# Patient Record
Sex: Male | Born: 1945
Health system: Southern US, Community
[De-identification: ages and names within clinical notes are randomized; demographics above are authoritative.]

## PROBLEM LIST (undated history)

## (undated) DIAGNOSIS — T7840XA Allergy, unspecified, initial encounter: Secondary | ICD-10-CM

## (undated) DIAGNOSIS — J449 Chronic obstructive pulmonary disease, unspecified: Secondary | ICD-10-CM

## (undated) DIAGNOSIS — M1990 Unspecified osteoarthritis, unspecified site: Secondary | ICD-10-CM

## (undated) DIAGNOSIS — H269 Unspecified cataract: Secondary | ICD-10-CM

## (undated) DIAGNOSIS — C801 Malignant (primary) neoplasm, unspecified: Secondary | ICD-10-CM

## (undated) HISTORY — PX: PROSTATECTOMY: SHX69

## (undated) HISTORY — PX: COLONOSCOPY: SHX174

## (undated) HISTORY — PX: WRIST FRACTURE SURGERY: SHX121

## (undated) HISTORY — PX: POLYPECTOMY: SHX149

## (undated) HISTORY — DX: Malignant (primary) neoplasm, unspecified: C80.1

## (undated) HISTORY — DX: Unspecified cataract: H26.9

## (undated) HISTORY — DX: Chronic obstructive pulmonary disease, unspecified: J44.9

## (undated) HISTORY — PX: OTHER SURGICAL HISTORY: SHX169

## (undated) HISTORY — DX: Allergy, unspecified, initial encounter: T78.40XA

---

## 2005-11-18 DIAGNOSIS — C801 Malignant (primary) neoplasm, unspecified: Secondary | ICD-10-CM

## 2005-11-18 HISTORY — DX: Malignant (primary) neoplasm, unspecified: C80.1

## 2006-05-22 ENCOUNTER — Encounter (INDEPENDENT_AMBULATORY_CARE_PROVIDER_SITE_OTHER): Payer: Self-pay | Admitting: Specialist

## 2006-05-22 ENCOUNTER — Observation Stay (HOSPITAL_COMMUNITY): Admission: RE | Admit: 2006-05-22 | Discharge: 2006-05-23 | Payer: Self-pay | Admitting: Urology

## 2007-09-21 IMAGING — CR DG CHEST 2V
2 series · 2 of 2 positions shown · non-contrast
Comparison: none

CLINICAL DATA: 59-year-old male.  Preoperative evaluation.  Prostate carcinoma.  For robotic assisted prostatectomy.
 CHEST - 2 VIEW:

[view not recorded (1 of 2)]
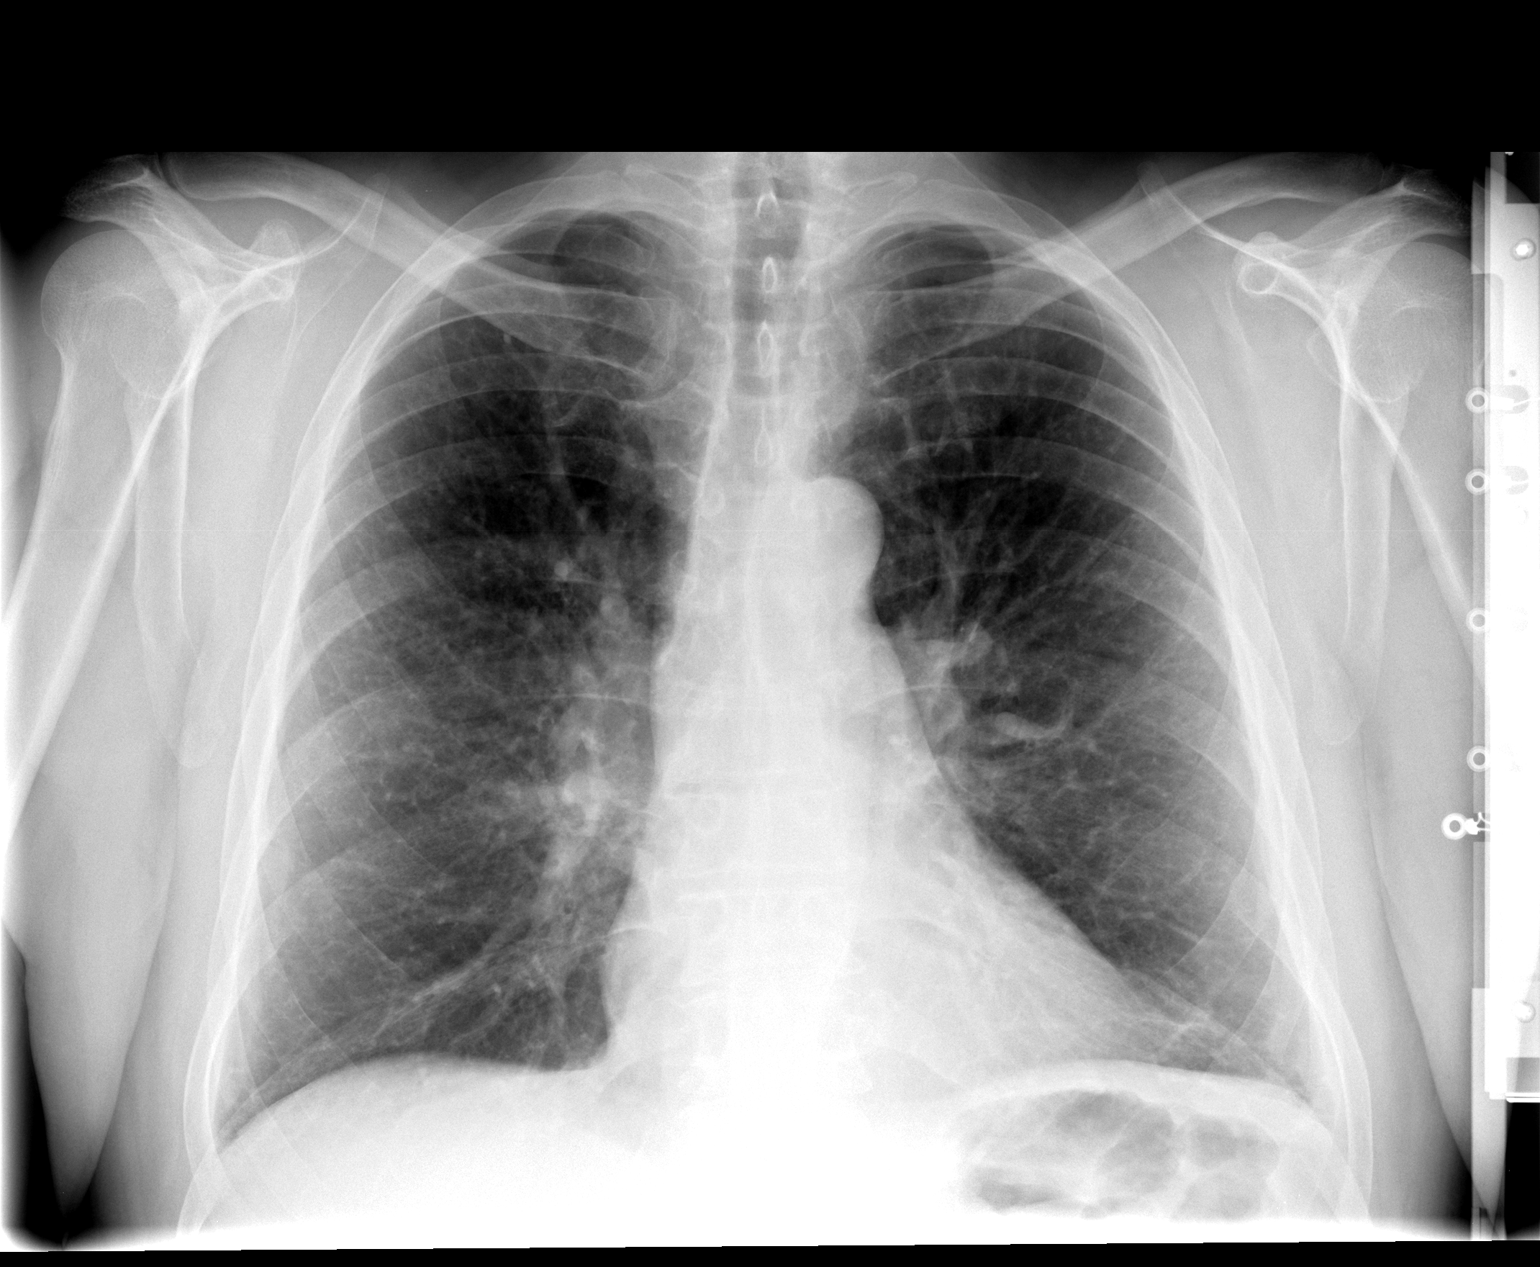

[view not recorded (2 of 2)]
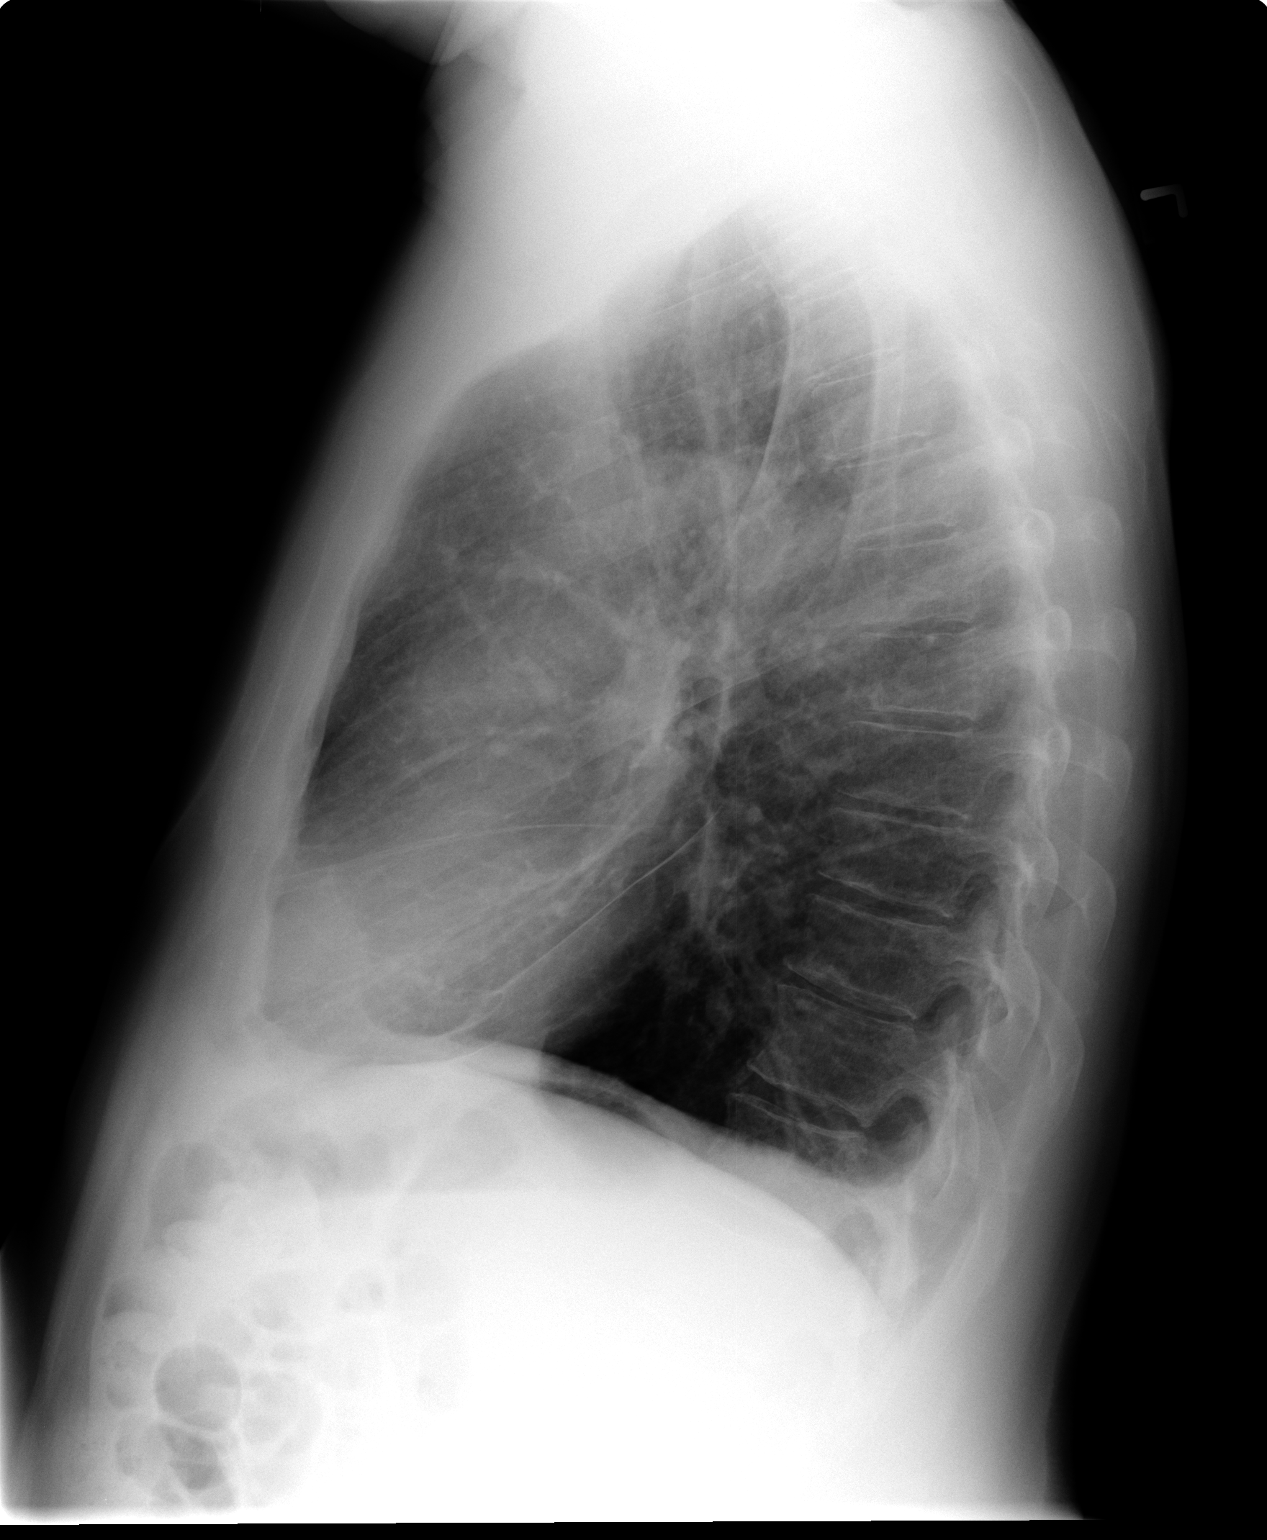

[2 of 2 positions shown; findings below may reference images not displayed]

FINDINGS: Mid vascular congestion and interstitial prominence with basilar scarring versus atelectasis.  Normal heart size.  No acute consolidation, pneumonia, edema, pneumothorax, or effusion.
IMPRESSION: 1.  No acute chest process. 
 2.  Mild vascular congestion, interstitial prominence, and bibasilar atelectasis versus scarring.

## 2008-04-26 ENCOUNTER — Ambulatory Visit: Payer: Self-pay | Admitting: Gastroenterology

## 2008-05-10 ENCOUNTER — Ambulatory Visit: Payer: Self-pay | Admitting: Gastroenterology

## 2009-03-15 ENCOUNTER — Encounter: Admission: RE | Admit: 2009-03-15 | Discharge: 2009-03-15 | Payer: Self-pay | Admitting: Family Medicine

## 2009-05-04 ENCOUNTER — Ambulatory Visit (HOSPITAL_COMMUNITY): Admission: RE | Admit: 2009-05-04 | Discharge: 2009-05-05 | Payer: Self-pay | Admitting: Urology

## 2011-02-25 LAB — CBC
HCT: 44.5 % (ref 39.0–52.0)
Hemoglobin: 14.7 g/dL (ref 13.0–17.0)
MCHC: 33 g/dL (ref 30.0–36.0)
MCV: 83.3 fL (ref 78.0–100.0)

## 2011-02-25 LAB — BASIC METABOLIC PANEL
BUN: 22 mg/dL (ref 6–23)
CO2: 26 mEq/L (ref 19–32)
Chloride: 109 mEq/L (ref 96–112)
GFR calc Af Amer: >60 mL/min (ref >60)
GFR calc non Af Amer: >60 mL/min (ref >60)
Glucose, Bld: 95 mg/dL (ref 70–99)
Potassium: 4 mEq/L (ref 3.5–5.1)

## 2011-02-25 LAB — TYPE AND SCREEN
ABO/RH(D): B POS
Antibody Screen: NEGATIVE

## 2011-02-25 LAB — PROTIME-INR
INR: 1 (ref 0.00–1.49)
Prothrombin Time: 12.8 seconds (ref 11.6–15.2)

## 2011-04-02 NOTE — Op Note (Signed)
NAME:  Scott Ho, Scott Ho NO.:  [id number]   MEDICAL RECORD NO.:  [id number]          PATIENT TYPE:  AMB   LOCATION:  DAY                          FACILITY:  [facility]   PHYSICIAN:  Martina Sinner, MD DATE OF BIRTH:  1946-09-07   DATE OF PROCEDURE:  05/04/2009  DATE OF DISCHARGE:                               OPERATIVE REPORT   PREOPERATIVE DIAGNOSIS:  Male stress urinary incontinence.   POSTOPERATIVE DIAGNOSES:  1. Male stress urinary incontinence.  2  Mild meatal stenosis.   SURGERY:  Insertion of male sling plus cystoscopy plus urethral  dilation.   ASSISTANT:  Heloise Purpura, M.D.   DESCRIPTION OF PROCEDURE:  Scott Ho has stress urinary continence.  Preoperative laboratory tests were normal.  Preoperative antibiotics  were given.  I put him in the high lithotomy position with knees at  shoulder height.  Extra care was taken with leg positioning to minimize  the risk of compartment syndrome, neuropathy, and DVT.  I marked the  obturator foramen.  He was prepped and draped.  He had mild meatal  stenosis.  I dilated him from 12-20 [place] with male sounds.  I then  easily inserted a 14-French catheter.   I marked the perineal incision in the usual position and carried it down  to mobilize the bulbous spongiosis muscle and split the muscle in the  midline.  I then mobilized the bulbar urethra and proximal to the  bifurcation of the corporal bodies I could feel and dissected repeat  down to the peroneal tendon at the S-turn to the bulbar urethra.  There  was a little bit of bleeding in this area and I used one 3-0 Vicryl to  oversew a superficial abrasion on the bulbar urethra and I also used  cautery.   I then cystoscoped the patient and identified the bladder neck.  I did  the maneuver, pushing on the peroneal tendon parallel the floor, and  there was excellent ascensus of the bladder neck and coaptation.   At this point, I placed a 3-0 Vicryl suture at  the peroneal tendon  location through the bulbous spongiosis.   __________ to identify the upper edge of the triangle between the  inferior ramus and the bulbar urethra.   I could easily mark the peroneal tendon.  I could feel the upper aspect  of the obturator foramen.  I used a spinal needle to mark appropriately.  I made a small stab incision just lateral from the foramen needle.  I  did a double pop maneuver on both sides with the male sling needle  driver under the pulp of my index finger through the upper edge of the  triangle bilaterally.  I attached a sling and drew it up bilaterally.  I  sewed the middle aspect of the graft fully at the level of the peroneal  tendon using the 3-0 Vicryl.  I gently pulled on both sides and there  was excellent base ascensus.  Copious irrigation with antibiotic was  utilized.  Four-layer closure with 3-0 Vicryl and 4-0 Vicryl was  utilized.  I made another stab incision on both sides 2 cm below the initial  incision and brought the sling through it.  I pulled the sling a little  bit tighter after bringing the knees closer together.  I cut below the  blue dots and removed the sheaths.  I cut the sling at the skin level.  4-0 Vicryl was used for the four stab incisions in the groin.  Dermabond  was applied after a dry pressure dressing was applied with mesh pants to  minimize any risk of bleeding postprocedure.   Scott Ho catheter was draining at the end of the case.  Leg position  was good.  Hopefully this operation reached his treatment goal.           ______________________________  Martina Sinner, MD  Electronically Signed     SAM/MEDQ  D:  05/04/2009  T:  05/04/2009  Job:  [id number]   cc:   Heloise Purpura, MD  Fax: [phone]

## 2011-04-05 NOTE — Discharge Summary (Signed)
NAME:  Scott Ho, Scott Ho NO.:  [id number]   MEDICAL RECORD NO.:  [id number]          PATIENT TYPE:  INP   LOCATION:  1429                         FACILITY:  [facility]   PHYSICIAN:  Heloise Purpura, MD      DATE OF BIRTH:  [birth year]   DATE OF ADMISSION:  05/22/2006  DATE OF DISCHARGE:  05/23/2006                                 DISCHARGE SUMMARY   ADMISSION DIAGNOSIS:  Clinically localized prostate cancer.   DISCHARGE DIAGNOSIS:  Clinically localized prostate cancer.   PROCEDURE:  Robotic-assisted laparoscopic radical prostatectomy.   HISTORY:  For full details please see admission history and physical.  Briefly, [name] is a 65 year old gentleman with recently diagnosed  clinical stage T1C prostate cancer with a PSA of 5.09 and Gleason score  3+3=6.  After discussion regarding management options for clinically  localized prostate cancer, he elected to proceed with surgical therapy with  the above procedure.   HOSPITAL COURSE:  On May 22, 2006, the patient was taken to the operating  room and underwent a robotic assisted laparoscopic radical prostatectomy.  This procedure was uneventful and, postoperatively, he was able to be  transferred to a regular hospital room following recovery from anesthesia.  On the evening of postoperative day #0, he began ambulating which he did  without difficulty.  By postoperative day #1, he was able to begin a clear  liquid diet and was subsequently transitioned to oral pain medication.  He  was also noted to have very minimal output from his pelvic drain and  therefore it was removed.  He continued to maintain excellent urine output.  By the afternoon of postoperative day #1, he had met all discharge criteria  and was able to be discharged home in excellent condition.   DISPOSITION:  Home.   DISCHARGE MEDICATIONS:  [name] was instructed to resume his regular home  medications excepting any aspirin, nonsteroidal  anti-inflammatory drugs, or  herbal supplements.  He was given a prescription to take Vicodin as needed  for pain, Cipro as an antibiotic to begin one day prior to removal of his  Foley catheter, and to use Colace as a stool softener.   DISCHARGE INSTRUCTIONS:  The patient was instructed to be ambulatory but  specifically to refrain from any heavy lifting or strenuous activity.  He  was given instructions to gradually advance his diet once passing flatus.  He was instructed on signs and symptoms of wound infection as well as Foley  catheter care and told to call should he have any problems.   FOLLOW UP:  [name] will follow-up in one week for removal of Foley  catheter as well as to discuss his surgical pathology in detail.           ______________________________  Heloise Purpura, MD  Electronically Signed     LB/MEDQ  D:  05/23/2006  T:  05/23/2006  Job:  [id number]   cc:   Vale Haven. Andrey Campanile, M.D.  Fax: [phone]

## 2011-04-05 NOTE — H&P (Signed)
NAME:  [name], [name] NO.:  [id number]   MEDICAL RECORD NO.:  [id number]          PATIENT TYPE:  INP   LOCATION:  0003                         FACILITY:  [facility]   PHYSICIAN:  Heloise Purpura, MD      DATE OF BIRTH:  [birth year]   DATE OF ADMISSION:  05/22/2006  DATE OF DISCHARGE:                                HISTORY & PHYSICAL   CHIEF COMPLAINT:  Prostate cancer.   HISTORY:  [name] is a 65 year old gentleman with clinical stage T1C  prostate cancer with a PSA of 5.09 and Gleason score 3+3=6.  After  discussing management options for clinically localized prostate cancer, he  elected to proceed with surgical therapy.   PAST MEDICAL HISTORY:  Rheumatoid arthritis.   PAST SURGICAL HISTORY:  None.   MEDICATIONS:  1.  Fenoprofen p.r.n.  2.  Lorazepam 1 mg p.o. p.r.n.  3.  Prednisone 5 mg p.o. p.r.n.  4.  Hydrocodone 10/650 mg p.o. p.r.n.  5.  Dexedrine 10 mg p.o. daily.  6.  Propranolol p.r.n.   ALLERGIES:  NO KNOWN DRUG ALLERGIES.   FAMILY HISTORY:  No history of GU malignancy or prostate cancer.   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  He is married.  He does smoke 3-4 cigarettes per day.  He has smoked over 40  years.  He drinks alcohol occasionally.   PHYSICAL EXAMINATION:  CONSTITUTIONAL:  The patient is a well-nourished,  well-developed, age-appropriate male in no acute distress.  CARDIOVASCULAR:  Regular rate and rhythm without obvious murmurs.  LUNGS:  Clear bilaterally.  ABDOMEN:  Soft, nontender, nondistended, without abdominal masses or bruits.  DIGITAL RECTAL EXAMINATION:  No nodularity or induration.   IMPRESSION:  Low-risk clinically localized prostate cancer.   PLAN:  [name] will undergo robotic-assisted laparoscopic radical  prostatectomy and then be admitted to the hospital for postoperative care.           ______________________________  Heloise Purpura, MD  Electronically Signed     LB/MEDQ  D:  05/22/2006  T:  05/22/2006  Job:  [id number]

## 2011-04-05 NOTE — Op Note (Signed)
NAME:  Scott Ho, Scott Ho NO.:  [id number]   MEDICAL RECORD NO.:  [id number]          PATIENT TYPE:  INP   LOCATION:  0003                         FACILITY:  [facility]   PHYSICIAN:  Heloise Purpura, MD      DATE OF BIRTH:  January 03, 1946   DATE OF PROCEDURE:  05/22/2006  DATE OF DISCHARGE:                                 OPERATIVE REPORT   PREOPERATIVE DIAGNOSIS:  Clinically localized adenocarcinoma of prostate.   POSTOPERATIVE DIAGNOSIS:  Clinically localized adenocarcinoma of prostate.   PROCEDURE:  Robotic assisted laparoscopic radical prostatectomy (bilateral  nerve sparing).   SURGEON:  Dr. Heloise Purpura.   ASSISTANT:  Dr. Cornelious Bryant.   ANESTHESIA:  General.   COMPLICATIONS:  None.   ESTIMATED BLOOD LOSS:  100 mL.   INTRAVENOUS FLUIDS:  2000 mL of lactated Ringer's.   SPECIMEN:  1.  Prostate and seminal vesicles.   DRAINS:  1.  20-French straight catheter.  2.  #19 Blake pelvic drain.   INDICATION:  Scott Ho is a 65 year old gentleman with clinical stage T1C  prostate cancer with a PSA of 5.09 and Gleason score of 3+3=6.  His  preoperative IPSS score was 14 with an IIEF score of 5.  After discussing  management options for clinically localized prostate cancer, the patient  elected to proceed with the above procedure.  Potential risks and benefits  were discussed with the patient and he consented.   DESCRIPTION OF PROCEDURE:  The patient was taken to the operating room and a  general anesthetic was administered.  He was given preoperative antibiotics,  placed in the dorsal lithotomy position, prepped and draped in the usual  sterile fashion.  Next a preoperative time-out was performed.  A Foley  catheter was then inserted into the bladder.  A site was selected 18 cm from  the pubic symphysis and just to the left of the umbilicus for placement of  the camera port.  This was placed using a standard open Hasson technique.  This allowed entry into the  peritoneal cavity under direct vision and a 12  mm port was placed and a pneumoperitoneum established.  The remaining ports  were then placed.  Bilateral 8 mm robotic ports were placed 16 cm from the  pubic symphysis and 10 cm lateral to the camera port.  An additional 8 mm  robotic port was placed in the far left lateral abdominal wall.  5 mm port  was placed between the camera port and the right robotic port.  An  additional 12 mm port was placed in the far right lateral abdominal wall for  laparoscopic assistance.  All ports were placed under direct vision and  without difficulty.  The surgical cart was then docked.  With the aid of the  cautery scissors, the bladder was reflected posteriorly allowing entry into  the space of Retzius and identification of the prostate endopelvic fascia.  The endopelvic fascia was then incised from the apex back to the base of the  prostate bilaterally allowing the underlying levator muscle fibers to be  swept laterally off  the prostate.  This isolated the dorsal venous complex  which was then stapled and divided with a 45 mm flex ETS stapler.  Attention  then turned to the bladder neck which was entered anteriorly.  This exposed  the Foley catheter.  The Foley catheter balloon was deflated.  The Foley  catheter was then used to retract the prostate anteriorly, thereby exposing  the posterior bladder neck.  The posterior bladder neck was then divided and  dissection continued posteriorly until the vasa deferentia and seminal  vesicles were identified.  The vasa deferentia were isolated and divided.  The seminal vesicles were dissected free, isolated and then lifted  anteriorly with care to control seminal vesicle arteries.  The space between  Denonvilliers' fascia and the anterior rectum was then bluntly developed  thereby isolating the vascular pedicles of the prostate.  Attention then  turned to the anterior prostate and the lateral prostatic fascia was  incised  bilaterally allowing the neurovascular bundles to be swept laterally and  posteriorly off the prostate.  The vascular pedicles of the prostate were  then divided above the neurovascular bundles with Hem-o-lok clips.  Attention then turned to the urethra which was then sharply divided allowing  the prostate specimen to be disarticulated and placed up into the abdomen  for later removal.  The pelvis was then copiously irrigated and hemostasis  was excellent.  With irrigation in the pelvis, air was injected into the  rectal catheter and there was no evidence of a rectal injury.  Attention  then turned to the urethral anastomosis.  A 0 PDS buttressing suture was  then placed between the dorsal vein and the periosteum of the pubic  symphysis.  This helped the urethra to protrude in preparation for the  urethral anastomosis.  A double-armed 3-0 Monocryl suture was then used to  perform a 360 degrees running anastomosis between the bladder neck and  urethra in a tension-free manner.  A new 20-French coude catheter was then  inserted into the bladder and irrigated.  There were no blood clots within  the bladder and the anastomosis appeared to be watertight.  A #19 Blake  drain was then brought through the left robotic port appropriately  positioned in the pelvis.  It was secured to skin with a nylon suture.  The  surgical cart was then undocked.  The prostate specimen was placed into the  Endopouch retrieval bag via the periumbilical port.  A 0 Vicryl suture was  then used to close the right lateral 12 mm port site with the aid of a  suture passer.  All remaining ports were then removed under direct vision.  The periumbilical port was then used to remove the prostate specimen within  the Endopouch retrieval bag intact.  This fascial opening was then closed  with a running 0 Vicryl suture.  Quarter percent Marcaine was injected into all port sites which were reapproximated at the skin level  with staples.  Sterile dressings were applied.  All sponge and needle counts were correct  x2 at the end of the procedure.  The patient appeared to tolerate procedure  well and without complications.  He was able to be extubated and transferred  to recovery unit in satisfactory condition.           ______________________________  Heloise Purpura, MD  Electronically Signed     LB/MEDQ  D:  05/22/2006  T:  05/22/2006  Job:  [id number]

## 2011-04-05 NOTE — Discharge Summary (Signed)
NAME:  CYRUS, RAMSBURG NO.:  [id number]   MEDICAL RECORD NO.:  [id number]          PATIENT TYPE:  INP   LOCATION:  1429                         FACILITY:  [facility]   PHYSICIAN:  Heloise Purpura, MD      DATE OF BIRTH:  02/07/1946   DATE OF ADMISSION:  05/22/2006  DATE OF DISCHARGE:  05/23/2006                                 DISCHARGE SUMMARY   Audio too short to transcribe (less than 5 seconds)           ______________________________  Heloise Purpura, MD     LB/MEDQ  D:  05/23/2006  T:  05/23/2006  Job:  [id number]

## 2015-05-04 ENCOUNTER — Encounter: Payer: Self-pay | Admitting: Gastroenterology

## 2017-04-04 ENCOUNTER — Emergency Department (HOSPITAL_COMMUNITY): Payer: Medicare Other

## 2017-04-04 ENCOUNTER — Encounter (HOSPITAL_COMMUNITY): Payer: Self-pay | Admitting: Nurse Practitioner

## 2017-04-04 ENCOUNTER — Emergency Department (HOSPITAL_COMMUNITY)
Admission: EM | Admit: 2017-04-04 | Discharge: 2017-04-04 | Disposition: A | Discharge status: Home / Self Care | Payer: Medicare Other | Attending: Emergency Medicine | Admitting: Emergency Medicine

## 2017-04-04 DIAGNOSIS — Y929 Unspecified place or not applicable: Secondary | ICD-10-CM | POA: Diagnosis not present

## 2017-04-04 DIAGNOSIS — W1830XA Fall on same level, unspecified, initial encounter: Secondary | ICD-10-CM | POA: Insufficient documentation

## 2017-04-04 DIAGNOSIS — S59912A Unspecified injury of left forearm, initial encounter: Secondary | ICD-10-CM | POA: Diagnosis present

## 2017-04-04 DIAGNOSIS — Y939 Activity, unspecified: Secondary | ICD-10-CM | POA: Insufficient documentation

## 2017-04-04 DIAGNOSIS — Z79899 Other long term (current) drug therapy: Secondary | ICD-10-CM | POA: Insufficient documentation

## 2017-04-04 DIAGNOSIS — S62115A Nondisplaced fracture of triquetrum [cuneiform] bone, left wrist, initial encounter for closed fracture: Secondary | ICD-10-CM | POA: Insufficient documentation

## 2017-04-04 DIAGNOSIS — Y999 Unspecified external cause status: Secondary | ICD-10-CM | POA: Insufficient documentation

## 2017-04-04 DIAGNOSIS — S52572A Other intraarticular fracture of lower end of left radius, initial encounter for closed fracture: Secondary | ICD-10-CM | POA: Diagnosis not present

## 2017-04-04 HISTORY — DX: Unspecified osteoarthritis, unspecified site: M19.90

## 2017-04-04 MED ORDER — LIDOCAINE HCL 2 % IJ SOLN
INTRAMUSCULAR | Status: AC
  Filled 2017-04-04: qty 20

## 2017-04-04 MED ORDER — OXYCODONE-ACETAMINOPHEN 5-325 MG PO TABS
1.0000 | ORAL_TABLET | Freq: Once | ORAL | Status: AC
  Administered 2017-04-04: 1 via ORAL
  Filled 2017-04-04: qty 1

## 2017-04-04 MED ORDER — LIDOCAINE HCL (PF) 2 % IJ SOLN
10.0000 mL | Freq: Once | INTRAMUSCULAR | Status: DC
  Filled 2017-04-04: qty 10

## 2017-04-04 MED ORDER — OXYCODONE-ACETAMINOPHEN 5-325 MG PO TABS: 1.0000 | ORAL_TABLET | Freq: Four times a day (QID) | ORAL | 0 refills | Status: DC | PRN

## 2017-04-04 NOTE — Discharge Instructions (Addendum)
Did not let splint get wet. Keep arm clean and dry. Please follow-up with Dr. Apolonio Schneiders, hand surgeon, regarding today's visit next week. Call the office first thing Monday morning to set up an appointment. Please use Tylenol as needed for pain. Please rest, ice, elevate wrist. Take Percocet as needed for severe pain.   Get help right away if: You cannot move your fingers. You lose feeling in your fingers or your hand. Your hand or your fingers turn cold and pale or blue. You notice a bad smell coming from your cast. You have drainage from underneath your cast. You have new stains from blood or drainage seeping through your cast.

## 2017-04-04 NOTE — ED Notes (Signed)
Patient transported to X-ray 

## 2017-04-04 NOTE — ED Provider Notes (Signed)
[facility] DEPT Provider Note   CSN: [id number] Arrival date & time: 04/04/17  1711  By signing my name below, I, Sonum Patel, attest that this documentation has been prepared under the direction and in the presence of [place], [place]. Electronically Signed: Ludger Nutting, Scribe. 04/04/17. 6:07 PM.  History   Chief Complaint Chief Complaint  Patient presents with  . Wrist Injury    The history is provided by the patient. No language interpreter was used.     HPI Comments: Scott Ho is a 71 y.o. male who presents to the Emergency Department complaining of left wrist fracture that was diagnosed earlier today. Patient states he had a mechanical fall with a left wrist injury. He was seen by his PCP who diagnosed him with a fracture after an XRAY. He reports becoming hypotensive in the clinic so EMS was called to transport him to the ED. He was told to follow up with ortho but the offices had already closed so he was advised to come to the ED. He complains of constant left wrist pain that is worse with movement. He was given a Toradol injection which he states was mildly helpful. He denies any previous fractures or dislocations to the same area. He denies anti-coagulant use. He denies fever, chills, nausea, vomiting, diarrhea.   Past Medical History:  Diagnosis Date  . Arthritis     There are no active problems to display for this patient.   History reviewed. No pertinent surgical history.     Home Medications    Prior to Admission medications   Medication Sig Start Date End Date Taking? Authorizing Provider  oxyCODONE-acetaminophen (PERCOCET/ROXICET) 5-325 MG tablet Take 1-2 tablets by mouth every 6 (six) hours as needed for severe pain. 04/04/17   Bettey Costa, [place]    Family History History reviewed. No pertinent family history.  Social History Social History  Substance Use Topics  . Smoking status: Never Smoker  . Smokeless tobacco: Not on  file  . Alcohol use No     Allergies   Patient has no known allergies.   Review of Systems Review of Systems  Musculoskeletal: Positive for arthralgias and joint swelling.  Neurological: Negative for weakness and numbness.  All other systems reviewed and are negative.    Physical Exam Updated Vital Signs BP (!) 150/66   Pulse 73   Temp 97.8 F (36.6 C) (Oral)   Resp 18   SpO2 99%   Physical Exam  Constitutional: He appears well-developed and well-nourished. No distress.  Well appearing  HENT:  Head: Normocephalic and atraumatic.  Nose: Nose normal.  Eyes: Conjunctivae and EOM are normal.  Neck: Normal range of motion.  Cardiovascular: Normal rate and intact distal pulses.   2+ intact distal pulses to bilateral upper extremities  Pulmonary/Chest: Effort normal. No respiratory distress.  Normal work of breathing. No respiratory distress noted.   Abdominal: Soft.  Musculoskeletal: Normal range of motion.  Left wrist with moderate swelling. Mark of 2 cm area of ecchymosis noted to dorsal side of the left wrist. There is maximal tenderness over dorsal ulnar side of the wrist. Limited range of motion due to pain. Mild deformity noted.  Neurological: He is alert.  Sensation intact to bilateral upper extremity. Muscle strength 5/5 and good against resistance to wrist flexion and extension and grip strength.   Skin: Skin is warm. No rash noted.  No redness noted to left wrist  Psychiatric: He has a normal mood and affect.  His behavior is normal.  Nursing note and vitals reviewed.    ED Treatments / Results  DIAGNOSTIC STUDIES: Oxygen Saturation is 99% on RA, normal by my interpretation.    COORDINATION OF CARE: 6:07 PM Discussed treatment plan with pt at bedside and pt agreed to plan.   Labs (all labs ordered are listed, but only abnormal results are displayed) Labs Reviewed - No data to display  EKG  EKG Interpretation None       Radiology Dg Wrist  Complete Left  Result Date: 04/04/2017 CLINICAL DATA:  Fall.  Left wrist pain. EXAM: LEFT WRIST - COMPLETE 3+ VIEW COMPARISON:  None available. FINDINGS: A comminuted distal left radial fracture is present. There is slight dorsal angulation. Fracture extends to the articular surface. The ulna is intact. Extensive soft tissue swelling is present over the dorsum of the wrist. Additional dorsal lucencies suggests a fracture of the triquetrum. IMPRESSION: 1. Comminuted distal radial fracture with dorsal angulation and intra-articular extension. 2. Dorsal triquetrum fracture. 3. Extensive soft tissue swelling over the dorsum of the wrist. Electronically Signed   By: San Morelle M.D.   On: 04/04/2017 19:14    Procedures Procedures (including critical care time)  Medications Ordered in ED Medications  lidocaine (XYLOCAINE) 2 % injection 10 mL (10 mLs Infiltration Not Given 04/04/17 2105)  lidocaine (XYLOCAINE) 2 % (with pres) injection (  Not Given 04/04/17 2109)  oxyCODONE-acetaminophen (PERCOCET/ROXICET) 5-325 MG per tablet 1 tablet (1 tablet Oral Given 04/04/17 1951)     Initial Impression / Assessment and Plan / ED Course  I have reviewed the triage vital signs and the nursing notes.  Pertinent labs & imaging results that were available during my care of the patient were reviewed by me and considered in my medical decision making (see chart for details).    Patient X-Ray with Comminuted distal radial fracture with dorsal angulation and intra-articular extension, dorsal triquetrum fracture. Pain managed in ED. Pt advised to follow up with hand surgery, Dr. Apolonio Schneiders, for further evaluation and treatment.  Pain managed in the department. Patient given a finger trap reduction here in ED. Patient given sugar tong splint while in ED, conservative therapy recommended and discussed. Patient will be dc home & is agreeable with above plan. I have also discussed reasons to return immediately to the ER.   Patient expresses understanding and agrees with plan.  I spoke with Dr. Apolonio Schneiders who recommended to have wrist reduced if there was a curvature or deformity, splint with sugar tong, and for outpatient referral to his office.  Patient also seen and evaluated by Dr. Ralene Bathe who agreed with assessment and plan.  Final Clinical Impressions(s) / ED Diagnoses   Final diagnoses:  Other closed intra-articular fracture of distal end of left radius, initial encounter  Nondisplaced fracture of triquetrum (cuneiform) bone, left wrist, initial encounter for closed fracture    New Prescriptions New Prescriptions   OXYCODONE-ACETAMINOPHEN (PERCOCET/ROXICET) 5-325 MG TABLET    Take 1-2 tablets by mouth every 6 (six) hours as needed for severe pain.    I personally performed the services described in this documentation, which was scribed in my presence. The recorded information has been reviewed and is accurate.   Flonnie Overman [place], [place] 04/04/17 2119    Quintella Reichert, MD 04/07/17 1539

## 2017-04-04 NOTE — ED Triage Notes (Signed)
Pt was at his doctors office being evaluated for the wrist pain when according to EMS he became hypotensive and almost passed out. The blood pressure has normalized but would still want to be evaluated for the left radial fx he was diagonised with.

## 2017-04-04 NOTE — ED Provider Notes (Signed)
Continued care of this patient at the request of Dr. Ralene Bathe.  Upon my assessment, patient had no acute complaints. Denied neurologic deficits.    Dg Wrist Complete Left  Result Date: 04/04/2017 CLINICAL DATA:  Status post reduction EXAM: LEFT WRIST - COMPLETE 3+ VIEW COMPARISON:  04/04/2017 FINDINGS: Interval placement of cast material across the wrist which obscures bone detail. Comminuted, impacted intra-articular distal radius fracture with residual dorsal displacement and angulation of distal fracture fragments. Small fracture fragments dorsal wrist suspect for triquetrum fracture. IMPRESSION: Interim placement of cast material across comminuted intra-articular distal radius fracture with residual dorsal displacement and angulation of distal fracture fragments. Additional bone fragments along the dorsal wrist suspicious for triquetrum fracture. Electronically Signed   By: Donavan Foil M.D.   On: 04/04/2017 21:56   Dg Wrist Complete Left  Result Date: 04/04/2017 CLINICAL DATA:  Fall.  Left wrist pain. EXAM: LEFT WRIST - COMPLETE 3+ VIEW COMPARISON:  None available. FINDINGS: A comminuted distal left radial fracture is present. There is slight dorsal angulation. Fracture extends to the articular surface. The ulna is intact. Extensive soft tissue swelling is present over the dorsum of the wrist. Additional dorsal lucencies suggests a fracture of the triquetrum. IMPRESSION: 1. Comminuted distal radial fracture with dorsal angulation and intra-articular extension. 2. Dorsal triquetrum fracture. 3. Extensive soft tissue swelling over the dorsum of the wrist. Electronically Signed   By: San Morelle M.D.   On: 04/04/2017 19:14     Clinical Course as of Apr 05 101  Fri Apr 04, 2017  2215 Patient has full flexion and extension of the fingers of his left hand. DIP and PIP joints were tested individually against resistance. Sensation was tested in each of the nerve distributions of the left hand  with no deficits noted when compared to the right hand.  [SJ]  2222 Spoke with Dr. Caralyn Guile, hand surgeon, and informed him that there were no significant changes between the pre-reduction and postreduction x-rays. I asked Dr. Caralyn Guile if we needed to reattempt reduction. Dr. Caralyn Guile states patient can remain splinted and follow up with him in the office on Monday. No need to reattempt reduction.  [SJ]    Clinical Course User Index [SJ] Jaynee Winters C, PA-C    No adjustment or removal of the splint necessary. No neurologic deficits noted on exam. Patient to follow-up with Dr. Caralyn Guile, hand surgeon, in the office. This information was communicated with the patient, who voiced understanding. Return precautions discussed.          Lorayne Bender, PA-C 04/05/17 0103    Quintella Reichert, MD 04/05/17 [phone]

## 2017-04-04 NOTE — ED Notes (Signed)
Joy, PA-C spoke with patient about calling orthopedics Monday and updated information. Pt and family verbalizes understanding. Pt ambulatory at discharge

## 2017-04-04 NOTE — ED Notes (Signed)
Bed: WTR8 Expected date:  Expected time:  Means of arrival:  Comments: 

## 2018-04-07 ENCOUNTER — Encounter: Payer: Self-pay | Admitting: Gastroenterology

## 2018-05-04 ENCOUNTER — Encounter: Payer: Self-pay | Admitting: Gastroenterology

## 2018-07-01 ENCOUNTER — Ambulatory Visit (AMBULATORY_SURGERY_CENTER): Payer: Self-pay

## 2018-07-01 VITALS — Ht 70.0 in | Wt 199.4 lb

## 2018-07-01 DIAGNOSIS — Z1211 Encounter for screening for malignant neoplasm of colon: Secondary | ICD-10-CM

## 2018-07-01 MED ORDER — NA SULFATE-K SULFATE-MG SULF 17.5-3.13-1.6 GM/177ML PO SOLN: 1.0000 | Freq: Once | ORAL | 0 refills | Status: AC

## 2018-07-01 NOTE — Progress Notes (Signed)
No egg or soy allergy known to patient  No issues with past sedation with any surgeries  or procedures, no intubation problems  No diet pills per patient No home 02 use per patient  No blood thinners per patient  Pt denies issues with constipation  No A fib or A flutter  EMMI video sent to pt's e mail. Pt denies  

## 2018-07-02 ENCOUNTER — Encounter: Payer: Self-pay | Admitting: Gastroenterology

## 2018-07-15 ENCOUNTER — Encounter: Payer: Self-pay | Admitting: Gastroenterology

## 2018-07-15 ENCOUNTER — Ambulatory Visit (AMBULATORY_SURGERY_CENTER): Payer: Medicare Other | Admitting: Gastroenterology

## 2018-07-15 VITALS — BP 121/60 | HR 61 | Temp 98.7°F | Resp 12 | Ht 70.0 in | Wt 199.0 lb

## 2018-07-15 DIAGNOSIS — Z1211 Encounter for screening for malignant neoplasm of colon: Secondary | ICD-10-CM

## 2018-07-15 DIAGNOSIS — D120 Benign neoplasm of cecum: Secondary | ICD-10-CM | POA: Diagnosis not present

## 2018-07-15 DIAGNOSIS — D124 Benign neoplasm of descending colon: Secondary | ICD-10-CM

## 2018-07-15 DIAGNOSIS — Z538 Procedure and treatment not carried out for other reasons: Secondary | ICD-10-CM | POA: Diagnosis not present

## 2018-07-15 MED ORDER — SODIUM CHLORIDE 0.9 % IV SOLN: 500.0000 mL | Freq: Once | INTRAVENOUS | Status: DC

## 2018-07-15 NOTE — Op Note (Signed)
[place] Patient Name: Scott Ho Procedure Date: 07/15/2018 10:24 AM MRN: [id number] Endoscopist: Remo Lipps P. Havery [name] , MD Age: 72 Referring MD:  Date of Birth: 27-Jul-1946 Gender: Male Account #: [id number] Procedure:                Colonoscopy Indications:              Screening for colorectal malignant neoplasm Medicines:                Monitored Anesthesia Care Procedure:                Pre-Anesthesia Assessment:                           - Prior to the procedure, a History and Physical                            was performed, and patient medications and                            allergies were reviewed. The patient's tolerance of                            previous anesthesia was also reviewed. The risks                            and benefits of the procedure and the sedation                            options and risks were discussed with the patient.                            All questions were answered, and informed consent                            was obtained. Prior Anticoagulants: The patient has                            taken no previous anticoagulant or antiplatelet                            agents. ASA Grade Assessment: II - A patient with                            mild systemic disease. After reviewing the risks                            and benefits, the patient was deemed in                            satisfactory condition to undergo the procedure.                           After obtaining informed consent, the colonoscope  was passed under direct vision. Throughout the                            procedure, the patient's blood pressure, pulse, and                            oxygen saturations were monitored continuously. The                            Model CF-HQ190L [id number]) scope was introduced                            through the anus and advanced to the the cecum,                            identified by  appendiceal orifice and ileocecal                            valve. The colonoscopy was performed without                            difficulty. The patient tolerated the procedure                            well. The quality of the bowel preparation was                            inadequate. The ileocecal valve, appendiceal                            orifice, and rectum were photographed. Scope In: 10:30:02 AM Scope Out: 10:44:28 AM Scope Withdrawal Time: 0 hours 11 minutes 37 seconds  Total Procedure Duration: 0 hours 14 minutes 26 seconds  Findings:                 The perianal and digital rectal examinations were                            normal.                           A moderate amount of semi-solid stool was found in                            the entire colon, making visualization difficult.                            The prep was inadequate for screening purposes and                            could not be cleared due to residual stool.                           A 8 to 10 mm polyp was found in the cecum. The  polyp was sessile. The polyp was removed with a                            cold snare. Resection and retrieval were complete.                           A 5 mm polyp was found in the descending colon. The                            polyp was sessile. The polyp was removed with a                            cold snare. Resection and retrieval were complete.                           The exam was otherwise without abnormality. Complications:            No immediate complications. Estimated blood loss:                            Minimal. Estimated Blood Loss:     Estimated blood loss was minimal. Impression:               - Preparation of the colon was inadequate for                            screening purposes.                           - One 8 to 10 mm polyp in the cecum, removed with a                            cold snare. Resected and  retrieved.                           - One 5 mm polyp in the descending colon, removed                            with a cold snare. Resected and retrieved.                           - The examination was otherwise normal. Recommendation:           - Patient has a contact number available for                            emergencies. The signs and symptoms of potential                            delayed complications were discussed with the                            patient. Return to normal activities tomorrow.  Written discharge instructions were provided to the                            patient.                           - Resume previous diet.                           - Continue present medications.                           - Await pathology results.                           - Repeat colonoscopy in the upcoming months with a                            2 day prep because the bowel preparation Demico Ploch P. Chrisanne Loose, MD 07/15/2018 10:51:10 AM This report has been signed electronically.

## 2018-07-15 NOTE — Patient Instructions (Addendum)
YOU HAD AN ENDOSCOPIC PROCEDURE TODAY AT [facility]:   Refer to the procedure report that was given to you for any specific questions about what was found during the examination.  If the procedure report does not answer your questions, please call your gastroenterologist to clarify.  If you requested that your care partner not be given the details of your procedure findings, then the procedure report has been included in a sealed envelope for you to review at your convenience later.  YOU SHOULD EXPECT: Some feelings of bloating in the abdomen. Passage of more gas than usual.  Walking can help get rid of the air that was put into your GI tract during the procedure and reduce the bloating. If you had a lower endoscopy (such as a colonoscopy or flexible sigmoidoscopy) you may notice spotting of blood in your stool or on the toilet paper. If you underwent a bowel prep for your procedure, you may not have a normal bowel movement for a few days.  Please Note:  You might notice some irritation and congestion in your nose or some drainage.  This is from the oxygen used during your procedure.  There is no need for concern and it should clear up in a day or so.  SYMPTOMS TO REPORT IMMEDIATELY:   Following lower endoscopy (colonoscopy or flexible sigmoidoscopy):  Excessive amounts of blood in the stool  Significant tenderness or worsening of abdominal pains  Swelling of the abdomen that is new, acute  Fever of 100F or higher  For urgent or emergent issues, a gastroenterologist can be reached at any hour by calling [phone].   DIET:  We do recommend a small meal at first, but then you may proceed to your regular diet.  Drink plenty of fluids but you should avoid alcoholic beverages for 24 hours.  MEDICATIONS: Continue present medications.  Please see handouts given to you by your recovery nurse.  Follow-up: Repeat colonoscopy in the upcoming months with a 2 day prep due to  suboptimal bowel preparation.  ACTIVITY:  You should plan to take it easy for the rest of today and you should NOT DRIVE or use heavy machinery until tomorrow (because of the sedation medicines used during the test).    FOLLOW UP: Our staff will call the number listed on your records the next business day following your procedure to check on you and address any questions or concerns that you may have regarding the information given to you following your procedure. If we do not reach you, we will leave a message.  However, if you are feeling well and you are not experiencing any problems, there is no need to return our call.  We will assume that you have returned to your regular daily activities without incident.  If any biopsies were taken you will be contacted by phone or by letter within the next 1-3 weeks.  Please call us at [phone] if you have not heard about the biopsies in 3 weeks.   Thank you for allowing [place] to provide for your healthcare needs today.  SIGNATURES/CONFIDENTIALITY: You and/or your care partner have signed paperwork which will be entered into your electronic medical record.  These signatures attest to the fact that that the information above on your After Visit Summary has been reviewed and is understood.  Full responsibility of the confidentiality of this discharge information lies with you and/or your care-partner.

## 2018-07-15 NOTE — Progress Notes (Signed)
Called to room to assist during endoscopic procedure.  Patient ID and intended procedure confirmed with present staff. Received instructions for my participation in the procedure from the performing physician.  

## 2018-07-15 NOTE — Progress Notes (Signed)
Report to PACU, RN, vss, BBS= Clear.  

## 2018-07-15 NOTE — Progress Notes (Signed)
Pt's states no medical or surgical changes since previsit or office visit. 

## 2018-07-16 ENCOUNTER — Telehealth: Payer: Self-pay

## 2018-07-16 NOTE — Telephone Encounter (Signed)
Second follow up phone call attempt, no answer, message left 

## 2018-07-16 NOTE — Telephone Encounter (Signed)
Left message on answering machine. 

## 2018-07-22 ENCOUNTER — Encounter: Payer: Self-pay | Admitting: Gastroenterology

## 2018-08-04 ENCOUNTER — Other Ambulatory Visit: Payer: Self-pay | Admitting: Family Medicine

## 2018-08-04 DIAGNOSIS — Z720 Tobacco use: Secondary | ICD-10-CM

## 2018-08-04 DIAGNOSIS — Z136 Encounter for screening for cardiovascular disorders: Secondary | ICD-10-CM

## 2018-08-18 ENCOUNTER — Ambulatory Visit
Admission: RE | Admit: 2018-08-18 | Discharge: 2018-08-18 | Disposition: A | Discharge status: Home / Self Care | Payer: Medicare Other | Source: Ambulatory Visit | Attending: Family Medicine | Admitting: Family Medicine

## 2018-08-18 DIAGNOSIS — Z136 Encounter for screening for cardiovascular disorders: Secondary | ICD-10-CM

## 2018-08-18 DIAGNOSIS — Z720 Tobacco use: Secondary | ICD-10-CM

## 2018-09-08 ENCOUNTER — Encounter: Payer: Self-pay | Admitting: Internal Medicine

## 2018-09-08 ENCOUNTER — Ambulatory Visit (INDEPENDENT_AMBULATORY_CARE_PROVIDER_SITE_OTHER): Payer: Medicare Other | Admitting: Internal Medicine

## 2018-09-08 VITALS — BP 132/82 | HR 80 | Ht 72.0 in | Wt 200.0 lb

## 2018-09-08 DIAGNOSIS — R9389 Abnormal findings on diagnostic imaging of other specified body structures: Secondary | ICD-10-CM | POA: Insufficient documentation

## 2018-09-08 DIAGNOSIS — J449 Chronic obstructive pulmonary disease, unspecified: Secondary | ICD-10-CM | POA: Diagnosis not present

## 2018-09-08 NOTE — Patient Instructions (Addendum)
Please schedule a follow up office visit in 6 weeks, call sooner if needed with full pfts on return  - also needs alpha one screening next ov with baseline esr at f/u

## 2018-09-08 NOTE — Progress Notes (Signed)
Scott Ho, male    DOB: 11-16-1946     MRN: [id number]    72 yowm with h/o RA since his 44's controls with prn prednisone   quit smoking 2014 with  some cough that resolved and bothered more by nasal congestion since around 2017 which is  no better with otc's or nasal but never seen by ent / allergy and underwent  LDSCT 08/19/18 which suggested bronchiolitis/ copd changes so referred to pulmonary clinic 09/08/2018 by Dr   Drema Dallas      09/08/2018 Pulmonary/ 1st office eval/ Shikha Bibb  Chief Complaint  Patient presents with  . Consult    Former smoker, abnormal CT, states he has occasional SOB denies chest discomfort or cough. States he just wanted to make sure he does not have cancer.   Dyspnea:  1.4 miles in 28 minutes around neighborhood includes some hills s limiting sob / more limited by arthritis typically than sob Cough: am congestion min white mucus  Sleep: no problem flat/ 2 pillows  SABA use: none   Previously needed pred 5 mg "daily" for RA but since around 2014 not needing regularly    No obvious day to day or daytime variability or assoc excess/ purulent sputum or mucus plugs or hemoptysis or cp or chest tightness, subjective wheeze or overt  hb symptoms.   Sleeps as above  without nocturnal  or early am exacerbation  of respiratory  c/o's or need for noct saba. Also denies any obvious fluctuation of symptoms with weather or environmental changes or other aggravating or alleviating factors except as outlined above   No unusual exposure hx or h/o childhood pna/ asthma or knowledge of premature birth.  Current Allergies, Complete Past Medical History, Past Surgical History, Family History, and Social History were reviewed in [employer] record.  ROS  The following are not active complaints unless bolded Hoarseness, sore throat, dysphagia, dental problems, itching, sneezing,  nasal congestion or discharge of excess mucus or purulent secretions, ear ache,    fever, chills, sweats, unintended wt loss or wt gain, classically pleuritic or exertional cp,  orthopnea pnd or arm/hand swelling  or leg swelling, presyncope, palpitations, abdominal pain, anorexia, nausea, vomiting, diarrhea  or change in bowel habits or change in bladder habits, change in stools or change in urine, dysuria, hematuria,  rash, arthralgias, visual complaints, headache, numbness, weakness or ataxia or problems with walking or coordination,  change in mood or  memory.            Past Medical History:  Diagnosis Date  . Allergy   . Arthritis   . Cancer [facility]) 2007   prostate  . Cataract    both eyes    Outpatient Medications Prior to Visit  Medication Sig Dispense Refill  . LORazepam (ATIVAN) 1 MG tablet Take 1 mg by mouth every 4 (four) hours as needed for anxiety.    . Multiple Vitamin (MULTIVITAMIN) tablet Take 1 tablet by mouth daily.    . Omega-3 Fatty Acids (FISH OIL) 1000 MG CAPS Take 1,000 mg by mouth daily.    . predniSONE (DELTASONE) 10 MG tablet Take 10 mg by mouth daily as needed.     . tadalafil (CIALIS) 10 MG tablet Take 10 mg by mouth daily as needed for erectile dysfunction.    . Zinc Sulfate (ZINC 15 PO) Take 15 mg by mouth daily.                 Objective:  BP 132/82   Pulse 80   Ht 6' (1.829 m)   Wt 200 lb (90.7 kg)   SpO2 97%   BMI 27.12 kg/m   SpO2: 97 %  RA   Hoarse intense amb wm nad    HEENT: nl dentition / oropharynx. Nl external ear canals without cough reflex -  Mod bilateral non-specific turbinate edema     NECK :  without JVD/Nodes/TM/ nl carotid upstrokes bilaterally   LUNGS: no acc muscle use,  Mild barrel  contour chest wall with bilateral  Distant bs s audible wheeze and  without cough on insp or exp maneuver and mild  Hyperresonant  to  percussion bilaterally     CV:  RRR  no s3 or murmur or increase in P2, and no edema   ABD:  soft and nontender with pos mid insp Hoover's  in the supine position. No bruits  or organomegaly appreciated, bowel sounds nl  MS:   Nl gait/  ext warm without deformities, calf tenderness, cyanosis or clubbing No obvious joint restrictions   SKIN: warm and dry without lesions    NEURO:  alert, approp, nl sensorium with  no motor or cerebellar deficits apparent.        I personally reviewed images and agree with radiology impression as follows:   Chest LDSCT  08/18/18 1. Lung-RADS 2, benign appearance or behavior. Continue annual screening with low-dose chest CT without contrast in 12 months. 2. Nonspecific mild patchy subpleural reticulation and tree-in-bud type opacity relatively symmetrically involving both lung bases, suggestive a nonspecific infectious or inflammatory bronchiolitis, with the differential including aspiration.  Aortic Atherosclerosis (ICD10-I70.0) and Emphysema (ICD10-J43.9).      Assessment   COPD GOLD III Spirometry 09/08/2018  FEV1 1.0 (30%)  Ratio 56 with classic curvature   On no rx   - Alpha One AT screening needed but not done today  Symptoms are group A = no flares, no doe but airflow obst is significant and there is emphysema on ct c/w  GOLD IIII criteria by spirometry.   informed the patient there was  no medication on the market that has proven to alter the [name]  curve/ its downward trajectory  or the likelihood of progression of their disease(unlike other chronic medical conditions such as atheroclerosis where we do think we can change the natural hx with risk reducing meds like statins)    Therefore stopping smoking and maintaining abstinence are  the most important aspects of care, not choice of inhalers or for that matter, doctors.Treatment other than smoking cessation  is entirely directed by severity of symptoms and focused also on reducing exacerbations, not attempting to change the natural history of the disease.      >>>>   At this point can't convince him he has symptoms other than non-specific chronic upper  airway complaints to will bring him back for full pfts.     Abnormal CT of the chest Reassured pt that findings don't suggest ca, the reason the study was done in the first place, and that   Although there are clearly abnormalities on CT scan, they should probably be considered "microscopic" since not obvious on plain cxr .     >>>> In the setting of obvious "macroscopic"  issues,  I am very reluctatnt to embark on an invasive w/u at this point but will arrange consevative  follow up and in the meantime see what we can do to address the patient's subjective concerns and sort out  whether RA has anything to do with these findings (eg resp bronchiolitis which unfortunately clinically is very hard to sort out from copd x that copd should stabilize if quits smoking whereas RA bronchiolitis follows/reflects the systemic severity most of the time.)     Total time devoted to counseling  > 50 % of initial 60 min office visit:  review case with pt/wife Harmon Pier discussion of options/alternatives/ personally creating written customized instructions  in presence of pt  then going over those specific  Instructions directly with the pt including how to use all of the meds but in particular covering each new medication in detail and the difference between the maintenance= "automatic" meds and the prns using an action plan format for the latter (If this problem/symptom => do that organization reading Left to right).  Please see AVS from this visit for a full list of these instructions which I personally wrote for this pt and  are unique to this visit.          Christinia Gully, MD 09/08/2018

## 2018-09-09 ENCOUNTER — Encounter: Payer: Self-pay | Admitting: Internal Medicine

## 2018-09-09 NOTE — Assessment & Plan Note (Addendum)
Reassured pt that findings don't suggest ca, the reason the study was done in the first place, and that   Although there are clearly abnormalities on CT scan, they should probably be considered "microscopic" since not obvious on plain cxr .     >>>> In the setting of obvious "macroscopic"  issues,  I am very reluctatnt to embark on an invasive w/u at this point but will arrange consevative  follow up and in the meantime see what we can do to address the patient's subjective concerns and sort out whether RA has anything to do with these findings (eg resp bronchiolitis which unfortunately clinically is very hard to sort out from copd x that copd should stabilize if quits smoking whereas RA bronchiolitis follows/reflects the systemic severity most of the time.)     Total time devoted to counseling  > 50 % of initial 60 min office visit:  review case with pt/wife Harmon Pier discussion of options/alternatives/ personally creating written customized instructions  in presence of pt  then going over those specific  Instructions directly with the pt including how to use all of the meds but in particular covering each new medication in detail and the difference between the maintenance= "automatic" meds and the prns using an action plan format for the latter (If this problem/symptom => do that organization reading Left to right).  Please see AVS from this visit for a full list of these instructions which I personally wrote for this pt and  are unique to this visit.

## 2018-09-09 NOTE — Assessment & Plan Note (Addendum)
Spirometry 09/08/2018  FEV1 1.0 (30%)  Ratio 56 with classic curvature   On no rx   - Alpha One AT screening needed but not done today  Symptoms are group A = no flares, no doe but airflow obst is significant and there is emphysema on ct c/w  GOLD IIII criteria by spirometry.   informed the patient there was  no medication on the market that has proven to alter the [name]  curve/ its downward trajectory  or the likelihood of progression of their disease(unlike other chronic medical conditions such as atheroclerosis where we do think we can change the natural hx with risk reducing meds like statins)    Therefore stopping smoking and maintaining abstinence are  the most important aspects of care, not choice of inhalers or for that matter, doctors.Treatment other than smoking cessation  is entirely directed by severity of symptoms and focused also on reducing exacerbations, not attempting to change the natural history of the disease.      >>>>   At this point can't convince him he has symptoms other than non-specific chronic upper airway complaints to will bring him back for full pfts.

## 2018-10-22 ENCOUNTER — Other Ambulatory Visit: Payer: Self-pay | Admitting: Internal Medicine

## 2018-10-22 DIAGNOSIS — J449 Chronic obstructive pulmonary disease, unspecified: Secondary | ICD-10-CM

## 2018-10-22 NOTE — Progress Notes (Unsigned)
pft  

## 2018-10-23 ENCOUNTER — Ambulatory Visit: Payer: Medicare Other | Admitting: Internal Medicine

## 2018-10-23 ENCOUNTER — Encounter: Payer: Self-pay | Admitting: Internal Medicine

## 2018-10-23 ENCOUNTER — Ambulatory Visit (INDEPENDENT_AMBULATORY_CARE_PROVIDER_SITE_OTHER): Payer: Medicare Other | Admitting: Internal Medicine

## 2018-10-23 VITALS — BP 122/70 | HR 70 | Ht 69.5 in | Wt 203.0 lb

## 2018-10-23 DIAGNOSIS — J449 Chronic obstructive pulmonary disease, unspecified: Secondary | ICD-10-CM | POA: Diagnosis not present

## 2018-10-23 LAB — CBC WITH DIFFERENTIAL/PLATELET
Basophils Absolute: 0 10*3/uL (ref 0.0–0.1)
Basophils Relative: 0.2 % (ref 0.0–3.0)
Eosinophils Absolute: 0.3 10*3/uL (ref 0.0–0.7)
Eosinophils Relative: 3.4 % (ref 0.0–5.0)
HCT: 45.3 % (ref 39.0–52.0)
Hemoglobin: 14.7 g/dL (ref 13.0–17.0)
Lymphocytes Relative: 32.3 % (ref 12.0–46.0)
Lymphs Abs: 2.4 10*3/uL (ref 0.7–4.0)
MCHC: 32.4 g/dL (ref 30.0–36.0)
MCV: 85.1 fl (ref 78.0–100.0)
Monocytes Absolute: 0.6 10*3/uL (ref 0.1–1.0)
Monocytes Relative: 8.3 % (ref 3.0–12.0)
NEUTROS ABS: 4.1 10*3/uL (ref 1.4–7.7)
NEUTROS PCT: 55.8 % (ref 43.0–77.0)
PLATELETS: 323 10*3/uL (ref 150.0–400.0)
RBC: 5.33 Mil/uL (ref 4.22–5.81)
RDW: 16.3 % — ABNORMAL HIGH (ref 11.5–15.5)
WBC: 7.4 10*3/uL (ref 4.0–10.5)

## 2018-10-23 LAB — PULMONARY FUNCTION TEST
DL/VA % pred: 124 %
DL/VA: 5.7 ml/min/mmHg/L
DLCO UNC % PRED: 81 %
DLCO unc: 25.89 ml/min/mmHg
FEF 25-75 PRE: 0.42 L/s
FEF 25-75 Post: 0.58 L/sec
FEF2575-%CHANGE-POST: 37 %
FEF2575-%PRED-PRE: 18 %
FEF2575-%Pred-Post: 25 %
FEV1-%Change-Post: 14 %
FEV1-%Pred-Post: 40 %
FEV1-%Pred-Pre: 35 %
FEV1-Post: 1.25 L
FEV1-Pre: 1.09 L
FEV1FVC-%CHANGE-POST: 3 %
FEV1FVC-%Pred-Pre: 63 %
FEV6-%CHANGE-POST: 11 %
FEV6-%PRED-PRE: 55 %
FEV6-%Pred-Post: 61 %
FEV6-PRE: 2.22 L
FEV6-Post: 2.48 L
FEV6FVC-%Change-Post: 0 %
FEV6FVC-%PRED-PRE: 101 %
FEV6FVC-%Pred-Post: 101 %
FVC-%CHANGE-POST: 10 %
FVC-%PRED-PRE: 54 %
FVC-%Pred-Post: 60 %
FVC-POST: 2.58 L
FVC-PRE: 2.34 L
POST FEV6/FVC RATIO: 96 %
Post FEV1/FVC ratio: 49 %
Pre FEV1/FVC ratio: 47 %
Pre FEV6/FVC Ratio: 95 %
RV % PRED: 127 %
RV: 3.13 L
TLC % pred: 82 %
TLC: 5.74 L

## 2018-10-23 LAB — SEDIMENTATION RATE: Sed Rate: 17 mm/hr (ref 0–20)

## 2018-10-23 MED ORDER — BUDESONIDE-FORMOTEROL FUMARATE 160-4.5 MCG/ACT IN AERO: 2.0000 | INHALATION_SPRAY | Freq: Two times a day (BID) | RESPIRATORY_TRACT | 11 refills | Status: DC

## 2018-10-23 MED ORDER — BUDESONIDE-FORMOTEROL FUMARATE 160-4.5 MCG/ACT IN AERO: 2.0000 | INHALATION_SPRAY | Freq: Two times a day (BID) | RESPIRATORY_TRACT | 0 refills | Status: DC

## 2018-10-23 NOTE — Patient Instructions (Addendum)
Symbicort 160 Take 2 puffs first thing in am and then another 2 puffs about 12 hours later.   Work on inhaler technique:  relax and gently blow all the way out then take a nice smooth deep breath back in, triggering the inhaler at same time you start breathing in.  Hold for up to 5 seconds if you can. Blow out thru nose. Rinse and gargle with water when done - if throat irritation get's worse we'll call you in a lower strength       Please remember to go to the lab department   for your tests - we will call you with the results when they are available.   Please schedule a follow up visit in 3 months but call sooner if needed

## 2018-10-23 NOTE — Progress Notes (Signed)
PFT done today. 

## 2018-10-23 NOTE — Progress Notes (Signed)
Scott Ho, male    DOB: 04-15-1946     MRN: [id number]   Brief patient profile:  23 yowm with h/o RA since his 52's controls with prn prednisone   quit smoking 2014 with  some cough that resolved and bothered more by nasal congestion since around 2017 which is  no better with otc's or nasal but never seen by ent / allergy and underwent  LDSCT 08/19/18 which suggested bronchiolitis/ copd changes so referred to pulmonary clinic 09/08/2018 by Dr   Drema Dallas and proved to have GOLD III criteria 10/23/2018    History of Present Illness  09/08/2018 Pulmonary/ 1st office eval/ Nilesh Stegall  Chief Complaint  Patient presents with  . Consult    Former smoker, abnormal CT, states he has occasional SOB denies chest discomfort or cough. States he just wanted to make sure he does not have cancer.   Dyspnea:  1.4 miles in 28 minutes around neighborhood includes some hills s limiting sob / more limited by arthritis typically than sob Cough: am congestion min white mucus  Sleep: no problem flat/ 2 pillows  Previously needed pred 5 mg "daily" for RA but since around 2014 not needing regularly  rec NO change rx     10/23/2018  f/u ov/Maleek Craver re: copd GOLD III  Chief Complaint  Patient presents with  . Follow-up    PFT's done today. His breathing is unchanged.   Dyspnea:  MMRC2 = can't walk a nl pace on a flat grade s sob but does fine slow and flat  Cough: some pnds /hoarsness x 1.5 y more of daytime issue s excess mucus  Sleeping: bed flat/ 2 pillows / on side   SABA use: none 02: none     No obvious day to day or daytime variability or assoc excess/ purulent sputum or mucus plugs or hemoptysis or cp or chest tightness, subjective wheeze or overt sinus or hb symptoms.   Sleeping as above without nocturnal  or early am exacerbation  of respiratory  c/o's or need for noct saba. Also denies any obvious fluctuation of symptoms with weather or environmental changes or other aggravating or alleviating factors  except as outlined above   No unusual exposure hx or h/o childhood pna/ asthma or knowledge of premature birth.  Current Allergies, Complete Past Medical History, Past Surgical History, Family History, and Social History were reviewed in [facility] record.  ROS  The following are not active complaints unless bolded Hoarseness, sore throat, dysphagia, dental problems, itching, sneezing,  nasal congestion or discharge of excess mucus or purulent secretions, ear ache,   fever, chills, sweats, unintended wt loss or wt gain, classically pleuritic or exertional cp,  orthopnea pnd or arm/hand swelling  or leg swelling, presyncope, palpitations, abdominal pain, anorexia, nausea, vomiting, diarrhea  or change in bowel habits or change in bladder habits, change in stools or change in urine, dysuria, hematuria,  rash, arthralgias, visual complaints, headache, numbness, weakness or ataxia or problems with walking or coordination,  change in mood or  memory.        Current Meds  Medication Sig  . B Complex Vitamins (B COMPLEX 100 PO) Take 1 capsule by mouth daily.  . Cyanocobalamin (B-12 PO) Take 1 capsule by mouth daily.  Marland Kitchen loratadine (CLARITIN) 10 MG tablet Take 10 mg by mouth daily.  Marland Kitchen LORazepam (ATIVAN) 1 MG tablet Take 1 mg by mouth every 4 (four) hours as needed for anxiety.  . Menaquinone-7 (VITAMIN  K2 PO) Take 1 capsule by mouth every other day.  . Multiple Vitamin (MULTIVITAMIN) tablet Take 1 tablet by mouth daily.  . Multiple Vitamins-Minerals (ICAPS) CAPS Take 1 capsule by mouth daily.  . Omega-3 Fatty Acids (FISH OIL) 1000 MG CAPS Take 1,000 mg by mouth daily.  . Pyridoxine HCl (B-6 PO) Take 1 capsule by mouth daily.  . tadalafil (CIALIS) 10 MG tablet Take 10 mg by mouth daily as needed for erectile dysfunction.  Marland Kitchen VITAMIN A PO Take 1 capsule by mouth every other day.  . vitamin C (ASCORBIC ACID) 500 MG tablet Take 500 mg by mouth daily.  . Zinc Sulfate (ZINC 15 PO)  Take 15 mg by mouth daily.                   Objective:     amb wm nad/ mildly hoarse   Wt Readings from Last 3 Encounters:  09/08/18 200 lb (90.7 kg)  07/15/18 199 lb (90.3 kg)  07/01/18 199 lb 6.4 oz (90.4 kg)     Vital signs reviewed - Note on arrival 02 sats  96% on RA        HEENT: nl dentition / oropharynx. Nl external ear canals without cough reflex -  Mild bilateral non-specific turbinate edema     NECK :  without JVD/Nodes/TM/ nl carotid upstrokes bilaterally   LUNGS: no acc muscle use,  Mild barrel  contour chest wall with bilateral late exp  wheeze and  without cough on insp or exp maneuver and mild  Hyperresonant  to  percussion bilaterally     CV:  RRR  no s3 or murmur or increase in P2, and no edema   ABD:  soft and nontender with pos late  insp Hoover's  in the supine position. No bruits or organomegaly appreciated, bowel sounds nl  MS:   Nl gait/  ext warm without deformities, calf tenderness, cyanosis or clubbing No obvious joint restrictions   SKIN: warm and dry without lesions    NEURO:  alert, approp, nl sensorium with  no motor or cerebellar deficits apparent.          Labs ordered 10/23/2018    Alpha one AT screen       Assessment

## 2018-10-24 ENCOUNTER — Encounter: Payer: Self-pay | Admitting: Internal Medicine

## 2018-10-24 NOTE — Assessment & Plan Note (Addendum)
Spirometry 09/08/2018  FEV1 1.0 (30%)  Ratio 56 with classic curvature   On no rx     - PFT's  10/23/2018  FEV1 1.25  (40 % ) ratio 49  p 14 % improvement from saba p nothing prior to study with DLCO  81 % corrects to 124 % for alv volume   - Alpha One AT screening sent   Not clear whether this is copd with AB component or RA related bronchiolitis (as suggested as a possibility on CT chest 08/18/18)  but either way best choice is trial of symb 160 2bid   - The proper method of use, as well as anticipated side effects, of a metered-dose inhaler are discussed and demonstrated to the patient. Improved effectiveness after extensive coaching during this visit to a level of approximately 75 % from a baseline of 50 %    - also needs alpha one at def screening to be complete.    I had an extended discussion with the patient and wife  reviewing all relevant studies completed to date and  lasting 15 to 20 minutes of a 25 minute visit    See device teaching which extended face to face time for this visit.  Each maintenance medication was reviewed in detail including emphasizing most importantly the difference between maintenance and prns and under what circumstances the prns are to be triggered using an action plan format that is not reflected in the computer generated alphabetically organized AVS which I have not found useful in most complex patients, especially with respiratory illnesses  Please see AVS for specific instructions unique to this visit that I personally wrote and verbalized to the the pt in detail and then reviewed with pt  by my nurse highlighting any  changes in therapy recommended at today's visit to their plan of care.

## 2018-10-28 LAB — ALPHA-1 ANTITRYPSIN PHENOTYPE: A-1 Antitrypsin, Ser: 139 mg/dL (ref 83–199)

## 2018-10-29 NOTE — Progress Notes (Signed)
Left detailed msg.

## 2019-01-16 ENCOUNTER — Encounter: Payer: Self-pay | Admitting: Gastroenterology

## 2019-01-22 ENCOUNTER — Ambulatory Visit: Payer: Medicare Other | Admitting: Internal Medicine

## 2019-01-26 ENCOUNTER — Ambulatory Visit: Payer: Medicare Other | Admitting: Internal Medicine

## 2019-01-26 ENCOUNTER — Encounter: Payer: Self-pay | Admitting: Internal Medicine

## 2019-01-26 VITALS — BP 110/70 | HR 80 | Ht 69.5 in | Wt 213.0 lb

## 2019-01-26 DIAGNOSIS — J449 Chronic obstructive pulmonary disease, unspecified: Secondary | ICD-10-CM | POA: Diagnosis not present

## 2019-01-26 MED ORDER — BUDESONIDE-FORMOTEROL FUMARATE 160-4.5 MCG/ACT IN AERO: 2.0000 | INHALATION_SPRAY | Freq: Two times a day (BID) | RESPIRATORY_TRACT | 0 refills | Status: DC

## 2019-01-26 NOTE — Assessment & Plan Note (Signed)
Quit smoking 2014 but also has RA Spirometry 09/08/2018  FEV1 1.0 (30%)  Ratio 56 with classic curvature   On no rx    - PFT's  10/23/2018  FEV1 1.25  (40 % ) ratio 49  p 14 % improvement from saba p nothing prior to study with DLCO  81 % corrects to 124 % for alv volume   - Alpha One AT screening 10/23/2018   MM level 139  - 01/26/2019  After extensive coaching inhaler device,  effectiveness =    75%  Above improvement in fev1 and since then further improvement in symptoms strongly supports more of an AB component of copd over RA related lung dz   I had an extended discussion with the patient reviewing all relevant studies completed to date and  lasting 15 to 20 minutes of a 25 minute visit    See device teaching which extended face to face time for this visit.  Each maintenance medication was reviewed in detail including emphasizing most importantly the difference between maintenance and prns and under what circumstances the prns are to be triggered using an action plan format that is not reflected in the computer generated alphabetically organized AVS which I have not found useful in most complex patients, especially with respiratory illnesses  Please see AVS for specific instructions unique to this visit that I personally wrote and verbalized to the the pt in detail and then reviewed with pt  by my nurse highlighting any  changes in therapy recommended at today's visit to their plan of care.

## 2019-01-26 NOTE — Progress Notes (Signed)
Scott Ho, male    DOB: May 12, 1946     MRN: [id number]     Brief patient profile:  29 yowm MM with h/o RA since his 37's controls with prn prednisone   quit smoking 2014 with  some cough that resolved and bothered more by nasal congestion since around 2017 which is  no better with otc's or nasal but never seen by ent / allergy and underwent  LDSCT 08/19/18 which suggested bronchiolitis/ copd changes so referred to pulmonary clinic 09/08/2018 by Dr   Drema Dallas and proved to have GOLD III criteria 10/23/2018    History of Present Illness  09/08/2018 Pulmonary/ 1st office eval/   Chief Complaint  Patient presents with  . Consult    Former smoker, abnormal CT, states he has occasional SOB denies chest discomfort or cough. States he just wanted to make sure he does not have cancer.   Dyspnea:  1.4 miles in 28 minutes around neighborhood includes some hills s limiting sob / more limited by arthritis typically than sob Cough: am congestion min white mucus  Sleep: no problem flat/ 2 pillows  Previously needed pred 5 mg "daily" for RA but since around 2014 not needing regularly  rec No change rx     10/23/2018  f/u ov/ re: copd GOLD III  Chief Complaint  Patient presents with  . Follow-up    PFT's done today. His breathing is unchanged.   Dyspnea:  MMRC2 = can't walk a nl pace on a flat grade s sob but does fine slow and flat  Cough: some pnds /hoarsness x 1.5 y more of daytime issue s excess mucus  Sleeping: bed flat/ 2 pillows / on side   rec Symbicort 160 Take 2 puffs first thing in am and then another 2 puffs about 12 hours later.  Work on Doctor, hospital.    01/26/2019  f/u ov/ re: GOLD III copd / symbicort 160 2bid  And RA and has pred so use prn very rarely  Chief Complaint  Patient presents with  . Follow-up    Breathing is overall doing well. No new co's.   Dyspnea:  MMRC1 = can walk nl pace, flat grade, can't hurry or go uphills or steps s sob   Cough:  no Sleeping: 2 pillows on side  SABA use: no 02: none   No obvious day to day or daytime variability or assoc excess/ purulent sputum or mucus plugs or hemoptysis or cp or chest tightness, subjective wheeze or overt sinus or hb symptoms.   Sleeping as abov without nocturnal  or early am exacerbation  of respiratory  c/o's or need for noct saba. Also denies any obvious fluctuation of symptoms with weather or environmental changes or other aggravating or alleviating factors except as outlined above   No unusual exposure hx or h/o childhood pna/ asthma or knowledge of premature birth.  Current Allergies, Complete Past Medical History, Past Surgical History, Family History, and Social History were reviewed in [employer] record.  ROS  The following are not active complaints unless bolded Hoarseness, sore throat, dysphagia, dental problems, itching, sneezing,  nasal congestion or discharge of excess mucus or purulent secretions, ear ache,   fever, chills, sweats, unintended wt loss or wt gain, classically pleuritic or exertional cp,  orthopnea pnd or arm/hand swelling  or leg swelling, presyncope, palpitations, abdominal pain, anorexia, nausea, vomiting, diarrhea  or change in bowel habits or change in bladder habits, change in stools or  change in urine, dysuria, hematuria,  rash, arthralgias, visual complaints, headache, numbness, weakness or ataxia or problems with walking or coordination,  change in mood or  memory.        Current Meds  Medication Sig  . B Complex Vitamins (B COMPLEX 100 PO) Take 1 capsule by mouth daily.  . budesonide-formoterol (SYMBICORT) 160-4.5 MCG/ACT inhaler Inhale 2 puffs into the lungs 2 (two) times daily.  . Cyanocobalamin (B-12 PO) Take 1 capsule by mouth daily.  Marland Kitchen loratadine (CLARITIN) 10 MG tablet Take 10 mg by mouth daily.  Marland Kitchen LORazepam (ATIVAN) 1 MG tablet Take 1 mg by mouth every 4 (four) hours as needed for anxiety.  . Menaquinone-7  (VITAMIN K2 PO) Take 1 capsule by mouth every other day.  . Multiple Vitamin (MULTIVITAMIN) tablet Take 1 tablet by mouth daily.  . Multiple Vitamins-Minerals (ICAPS) CAPS Take 1 capsule by mouth daily.  . Omega-3 Fatty Acids (FISH OIL) 1000 MG CAPS Take 1,000 mg by mouth daily.  . Pyridoxine HCl (B-6 PO) Take 1 capsule by mouth daily.  . tadalafil (CIALIS) 10 MG tablet Take 10 mg by mouth daily as needed for erectile dysfunction.  Marland Kitchen VITAMIN A PO Take 1 capsule by mouth every other day.  . vitamin C (ASCORBIC ACID) 500 MG tablet Take 500 mg by mouth daily.  . Zinc Sulfate (ZINC 15 PO) Take 15 mg by mouth daily.          Objective:     pleasant amb wm nad  Vital signs reviewed - Note on arrival 02 sats  97% on RA    01/26/2019        213    09/08/18 200 lb (90.7 kg)  07/15/18 199 lb (90.3 kg)  07/01/18 199 lb 6.4 oz (90.4 kg)      HEENT: nl dentition / oropharynx. Nl external ear canals without cough reflex -  Mild bilateral non-specific turbinate edema     NECK :  without JVD/Nodes/TM/ nl carotid upstrokes bilaterally   LUNGS: no acc muscle use,  Mild barrel  contour chest wall with bilateral  Distant bs s audible wheeze and  without cough on insp or exp maneuver and mild  Hyperresonant  to  percussion bilaterally     CV:  RRR  no s3 or murmur or increase in P2, and no edema   ABD:  soft and nontender with pos late  insp Hoover's  in the supine position. No bruits or organomegaly appreciated, bowel sounds nl  MS:   Nl gait/  ext warm without deformities, calf tenderness, cyanosis or clubbing No obvious joint restrictions   SKIN: warm and dry without lesions    NEURO:  alert, approp, nl sensorium with  no motor or cerebellar deficits apparent.              Assessment

## 2019-01-26 NOTE — Patient Instructions (Signed)
Work on inhaler technique:  relax and gently blow all the way out then take a nice smooth deep breath back in, triggering the inhaler at same time you start breathing in.  Hold for up to 5 seconds if you can. Blow out thru nose. Rinse and gargle with water when done     Please schedule a follow up visit in 6  months but call sooner if needed  

## 2019-02-02 ENCOUNTER — Encounter: Payer: Self-pay | Admitting: Gastroenterology

## 2019-02-23 ENCOUNTER — Ambulatory Visit (AMBULATORY_SURGERY_CENTER): Payer: Medicare Other | Admitting: *Deleted

## 2019-02-23 ENCOUNTER — Other Ambulatory Visit: Payer: Self-pay

## 2019-02-23 VITALS — Ht 69.5 in | Wt 208.0 lb

## 2019-02-23 DIAGNOSIS — Z860100 Personal history of colon polyps, unspecified: Secondary | ICD-10-CM

## 2019-02-23 MED ORDER — NA SULFATE-K SULFATE-MG SULF 17.5-3.13-1.6 GM/177ML PO SOLN: 1.0000 | Freq: Once | ORAL | 0 refills | Status: AC

## 2019-02-23 NOTE — Progress Notes (Signed)
No egg or soy allergy known to patient  No issues with past sedation with any surgeries  or procedures, no intubation problems  No diet pills per patient No home 02 use per patient  No blood thinners per patient  Pt denies issues with constipation  No A fib or A flutter  EMMI video sent to pt's e mail  Informed pt.that prep instructions will be mailed. Prep instructions reviewed with pt. Via phone.

## 2019-03-10 ENCOUNTER — Encounter: Payer: Medicare Other | Admitting: Gastroenterology

## 2019-03-31 ENCOUNTER — Encounter: Payer: Medicare Other | Admitting: Gastroenterology

## 2019-03-31 ENCOUNTER — Encounter: Payer: Self-pay | Admitting: Gastroenterology

## 2019-04-13 ENCOUNTER — Telehealth: Payer: Self-pay | Admitting: *Deleted

## 2019-04-13 NOTE — Telephone Encounter (Signed)
Covid-19 travel screening questions  Have you traveled in the last 14 days? no If yes where?  Do you now or have you had a fever in the last 14 days? no  Do you have any respiratory symptoms of shortness of breath or cough now or in the last 14 days? no  Do you have any family members or close contacts with diagnosed or suspected Covid-19? No  Spoke with wife- Is aware that care partner will be waiting in car during procedure.  Pt will wear a mask into building

## 2019-04-14 ENCOUNTER — Encounter: Payer: Self-pay | Admitting: Gastroenterology

## 2019-04-14 ENCOUNTER — Other Ambulatory Visit: Payer: Self-pay

## 2019-04-14 ENCOUNTER — Ambulatory Visit (AMBULATORY_SURGERY_CENTER): Payer: Medicare Other | Admitting: Gastroenterology

## 2019-04-14 VITALS — BP 116/71 | HR 66 | Temp 97.7°F | Resp 13 | Ht 69.5 in | Wt 213.0 lb

## 2019-04-14 DIAGNOSIS — D120 Benign neoplasm of cecum: Secondary | ICD-10-CM | POA: Diagnosis not present

## 2019-04-14 DIAGNOSIS — Z860100 Personal history of colon polyps, unspecified: Secondary | ICD-10-CM

## 2019-04-14 DIAGNOSIS — D122 Benign neoplasm of ascending colon: Secondary | ICD-10-CM

## 2019-04-14 DIAGNOSIS — D123 Benign neoplasm of transverse colon: Secondary | ICD-10-CM

## 2019-04-14 MED ORDER — SODIUM CHLORIDE 0.9 % IV SOLN: 500.0000 mL | Freq: Once | INTRAVENOUS | Status: AC

## 2019-04-14 NOTE — Patient Instructions (Signed)
Handouts Provided:  Polyps  YOU HAD AN ENDOSCOPIC PROCEDURE TODAY AT [facility]:   Refer to the procedure report that was given to you for any specific questions about what was found during the examination.  If the procedure report does not answer your questions, please call your gastroenterologist to clarify.  If you requested that your care partner not be given the details of your procedure findings, then the procedure report has been included in a sealed envelope for you to review at your convenience later.  YOU SHOULD EXPECT: Some feelings of bloating in the abdomen. Passage of more gas than usual.  Walking can help get rid of the air that was put into your GI tract during the procedure and reduce the bloating. If you had a lower endoscopy (such as a colonoscopy or flexible sigmoidoscopy) you may notice spotting of blood in your stool or on the toilet paper. If you underwent a bowel prep for your procedure, you may not have a normal bowel movement for a few days.  Please Note:  You might notice some irritation and congestion in your nose or some drainage.  This is from the oxygen used during your procedure.  There is no need for concern and it should clear up in a day or so.  SYMPTOMS TO REPORT IMMEDIATELY:   Following lower endoscopy (colonoscopy or flexible sigmoidoscopy):  Excessive amounts of blood in the stool  Significant tenderness or worsening of abdominal pains  Swelling of the abdomen that is new, acute  Fever of 100F or higher  For urgent or emergent issues, a gastroenterologist can be reached at any hour by calling [phone].   DIET:  We do recommend a small meal at first, but then you may proceed to your regular diet.  Drink plenty of fluids but you should avoid alcoholic beverages for 24 hours.  ACTIVITY:  You should plan to take it easy for the rest of today and you should NOT DRIVE or use heavy machinery until tomorrow (because of the sedation  medicines used during the test).    FOLLOW UP: Our staff will call the number listed on your records 48-72 hours following your procedure to check on you and address any questions or concerns that you may have regarding the information given to you following your procedure. If we do not reach you, we will leave a message.  We will attempt to reach you two times.  During this call, we will ask if you have developed any symptoms of COVID 19. If you develop any symptoms (ie: fever, flu-like symptoms, shortness of breath, cough etc.) before then, please call [phone].  If you test positive for Covid 19 in the 2 weeks post procedure, please call and report this information to [place].    If any biopsies were taken you will be contacted by phone or by letter within the next 1-3 weeks.  Please call us at [phone] if you have not heard about the biopsies in 3 weeks.    SIGNATURES/CONFIDENTIALITY: You and/or your care partner have signed paperwork which will be entered into your electronic medical record.  These signatures attest to the fact that that the information above on your After Visit Summary has been reviewed and is understood.  Full responsibility of the confidentiality of this discharge information lies with you and/or your care-partner.

## 2019-04-14 NOTE — Progress Notes (Signed)
Called to room to assist during endoscopic procedure.  Patient ID and intended procedure confirmed with present staff. Received instructions for my participation in the procedure from the performing physician.  

## 2019-04-14 NOTE — Op Note (Addendum)
[name] Patient Name: Scott Ho Procedure Date: 04/14/2019 10:01 AM MRN: [id number] Endoscopist: Remo Lipps P. Havery [name] , MD Age: 73 Referring MD:  Date of Birth: 05-Mar-1946 Gender: Male Account #: [id number] Procedure:                Colonoscopy Indications:              High risk colon cancer surveillance: Personal                            history of colonic polyps (adenomas removed on                            06/2018 - poor prep at that time - here for                            surveillance), s/p double prep for this procedure Medicines:                Monitored Anesthesia Care Procedure:                Pre-Anesthesia Assessment:                           - Prior to the procedure, a History and Physical                            was performed, and patient medications and                            allergies were reviewed. The patient's tolerance of                            previous anesthesia was also reviewed. The risks                            and benefits of the procedure and the sedation                            options and risks were discussed with the patient.                            All questions were answered, and informed consent                            was obtained. Prior Anticoagulants: The patient has                            taken no previous anticoagulant or antiplatelet                            agents. ASA Grade Assessment: II - A patient with                            mild systemic disease. After reviewing the risks  and benefits, the patient was deemed in                            satisfactory condition to undergo the procedure.                           After obtaining informed consent, the colonoscope                            was passed under direct vision. Throughout the                            procedure, the patient's blood pressure, pulse, and                            oxygen saturations were  monitored continuously. The                            Colonoscope was introduced through the anus and                            advanced to the the cecum, identified by                            appendiceal orifice and ileocecal valve. The                            colonoscopy was performed without difficulty. The                            patient tolerated the procedure well. The quality                            of the bowel preparation was adequate. The                            ileocecal valve, appendiceal orifice, and rectum                            were photographed. Scope In: 10:08:33 AM Scope Out: 10:30:21 AM Scope Withdrawal Time: 0 hours 19 minutes 14 seconds  Total Procedure Duration: 0 hours 21 minutes 48 seconds  Findings:                 The perianal and digital rectal examinations were                            normal.                           A diminutive polyp was found in the cecum. The                            polyp was sessile. The polyp was removed with a  cold snare. Resection and retrieval were complete.                           A 3 mm polyp was found in the ascending colon. The                            polyp was sessile. The polyp was removed with a                            cold snare. Resection and retrieval were complete.                           Three sessile polyps were found in the transverse                            colon. The polyps were 2 to 3 mm in size. These                            polyps were removed with a cold snare. Resection                            and retrieval were complete.                           Internal hemorrhoids were found during retroflexion.                           There was retained stool and what appeared to be                            multiple pieces of gum throughout the colon, time                            was taken to lavage the colon to achieve adequate                             views. The exam was otherwise without abnormality. Complications:            No immediate complications. Estimated blood loss:                            Minimal. Estimated Blood Loss:     Estimated blood loss was minimal. Impression:               - One diminutive polyp in the cecum, removed with a                            cold snare. Resected and retrieved.                           - One 3 mm polyp in the ascending colon, removed  with a cold snare. Resected and retrieved.                           - Three 2 to 3 mm polyps in the transverse colon,                            removed with a cold snare. Resected and retrieved.                           - Internal hemorrhoids.                           - The examination was otherwise normal. Recommendation:           - Patient has a contact number available for                            emergencies. The signs and symptoms of potential                            delayed complications were discussed with the                            patient. Return to normal activities tomorrow.                            Written discharge instructions were provided to the                            patient.                           - Resume previous diet.                           - Continue present medications.                           - Await pathology results. Remo Lipps P. Armbruster, MD 04/14/2019 10:35:20 AM This report has been signed electronically.

## 2019-04-14 NOTE — Progress Notes (Signed)
PT taken to PACU. Monitors in place. VSS. Report given to RN. 

## 2019-04-14 NOTE — Progress Notes (Signed)
Pt's states no medical or surgical changes since previsit or office visit.  Temps taken by [facility], CMA V/S taken by Rica Mote, CMA

## 2019-04-16 ENCOUNTER — Telehealth: Payer: Self-pay | Admitting: *Deleted

## 2019-04-16 NOTE — Telephone Encounter (Signed)
  Follow up Call-  Call back number 04/14/2019 07/15/2018  Post procedure Call Back phone  # [phone]  Permission to leave phone message Yes Yes  Some recent data might be hidden     Patient questions:  Do you have a fever, pain , or abdominal swelling? No. Pain Score  0 *  Have you tolerated food without any problems? Yes.    Have you been able to return to your normal activities? Yes.    Do you have any questions about your discharge instructions: Diet   No. Medications  No. Follow up visit  No.  Do you have questions or concerns about your Care? No.  Actions: * If pain score is 4 or above: No action needed   1. Have you developed a fever since your procedure? NO  2.   Have you had an respiratory symptoms (SOB or cough) since your procedure? NO  3.   Have you tested positive for COVID 19 since your procedure NO  4.   Have you had any family members/close contacts diagnosed with the COVID 19 since your procedure?  NO   If yes to any of these questions please route to Joylene Aneudy, RN and Alphonsa Gin, RN.

## 2019-11-04 ENCOUNTER — Other Ambulatory Visit: Payer: Self-pay | Admitting: Internal Medicine

## 2019-11-04 MED ORDER — BUDESONIDE-FORMOTEROL FUMARATE 160-4.5 MCG/ACT IN AERO: 2.0000 | INHALATION_SPRAY | Freq: Two times a day (BID) | RESPIRATORY_TRACT | 0 refills | Status: DC

## 2019-11-27 ENCOUNTER — Encounter (HOSPITAL_BASED_OUTPATIENT_CLINIC_OR_DEPARTMENT_OTHER): Payer: Self-pay

## 2019-11-27 ENCOUNTER — Emergency Department (HOSPITAL_BASED_OUTPATIENT_CLINIC_OR_DEPARTMENT_OTHER): Payer: Medicare Other

## 2019-11-27 ENCOUNTER — Emergency Department (HOSPITAL_BASED_OUTPATIENT_CLINIC_OR_DEPARTMENT_OTHER)
Admission: EM | Admit: 2019-11-27 | Discharge: 2019-11-27 | Disposition: A | Discharge status: Home / Self Care | Payer: Medicare Other | Attending: Emergency Medicine | Admitting: Emergency Medicine

## 2019-11-27 ENCOUNTER — Other Ambulatory Visit: Payer: Self-pay

## 2019-11-27 DIAGNOSIS — Z87891 Personal history of nicotine dependence: Secondary | ICD-10-CM | POA: Insufficient documentation

## 2019-11-27 DIAGNOSIS — W010XXA Fall on same level from slipping, tripping and stumbling without subsequent striking against object, initial encounter: Secondary | ICD-10-CM | POA: Insufficient documentation

## 2019-11-27 DIAGNOSIS — S62101A Fracture of unspecified carpal bone, right wrist, initial encounter for closed fracture: Secondary | ICD-10-CM

## 2019-11-27 DIAGNOSIS — Z8546 Personal history of malignant neoplasm of prostate: Secondary | ICD-10-CM | POA: Diagnosis not present

## 2019-11-27 DIAGNOSIS — S52591A Other fractures of lower end of right radius, initial encounter for closed fracture: Secondary | ICD-10-CM | POA: Diagnosis not present

## 2019-11-27 DIAGNOSIS — Y9301 Activity, walking, marching and hiking: Secondary | ICD-10-CM | POA: Insufficient documentation

## 2019-11-27 DIAGNOSIS — Z91030 Bee allergy status: Secondary | ICD-10-CM | POA: Diagnosis not present

## 2019-11-27 DIAGNOSIS — Z79899 Other long term (current) drug therapy: Secondary | ICD-10-CM | POA: Diagnosis not present

## 2019-11-27 DIAGNOSIS — Y999 Unspecified external cause status: Secondary | ICD-10-CM | POA: Diagnosis not present

## 2019-11-27 DIAGNOSIS — Y92009 Unspecified place in unspecified non-institutional (private) residence as the place of occurrence of the external cause: Secondary | ICD-10-CM | POA: Diagnosis not present

## 2019-11-27 DIAGNOSIS — J449 Chronic obstructive pulmonary disease, unspecified: Secondary | ICD-10-CM | POA: Diagnosis not present

## 2019-11-27 DIAGNOSIS — S6991XA Unspecified injury of right wrist, hand and finger(s), initial encounter: Secondary | ICD-10-CM | POA: Diagnosis present

## 2019-11-27 MED ORDER — ONDANSETRON 4 MG PO TBDP
4.0000 mg | ORAL_TABLET | Freq: Once | ORAL | Status: AC
  Administered 2019-11-27: 4 mg via ORAL
  Filled 2019-11-27: qty 1

## 2019-11-27 MED ORDER — MORPHINE SULFATE (PF) 4 MG/ML IV SOLN
4.0000 mg | Freq: Once | INTRAVENOUS | Status: AC
  Administered 2019-11-27: 4 mg via INTRAMUSCULAR
  Filled 2019-11-27: qty 1

## 2019-11-27 MED ORDER — HYDROCODONE-ACETAMINOPHEN 5-325 MG PO TABS: 1.0000 | ORAL_TABLET | ORAL | 0 refills | Status: DC | PRN

## 2019-11-27 NOTE — ED Triage Notes (Signed)
Pt slide on a rug at home and fell injuring his R wrist. Pt denies hitting his head. No LOC.

## 2019-11-27 NOTE — ED Provider Notes (Signed)
[facility] EMERGENCY DEPARTMENT Provider Note   CSN: [id number] Arrival date & time: 11/27/19  1526     History Chief Complaint  Patient presents with  . Fall    Scott Ho is a 74 y.o. male.  Pt presents to the ED today with right wrist pain.  Pt said he slipped on a rug and had a [place].  The pt said he is right handed.  Pt denies any other injury.          Past Medical History:  Diagnosis Date  . Allergy   . Arthritis   . Cancer [facility]) 2007   prostate  . Cataract    both eyes,removed  . COPD (chronic obstructive pulmonary disease) (HCC)    light case use inhaler    Patient Active Problem List   Diagnosis Date Noted  . Abnormal CT of the chest 09/08/2018  . COPD GOLD III 09/08/2018    Past Surgical History:  Procedure Laterality Date  . cataracts Bilateral   . COLONOSCOPY    . POLYPECTOMY    . PROSTATECTOMY    . WRIST FRACTURE SURGERY Left        Family History  Problem Relation Age of Onset  . Colon cancer Neg Hx   . Colon polyps Neg Hx   . Esophageal cancer Neg Hx   . Stomach cancer Neg Hx   . Rectal cancer Neg Hx     Social History   Tobacco Use  . Smoking status: Former Smoker    Types: Cigarettes    Quit date: 2014    Years since quitting: 7.0  . Smokeless tobacco: Never Used  Substance Use Topics  . Alcohol use: Yes    Comment: occasinally",once every couple of weeks"  . Drug use: No    Home Medications Prior to Admission medications   Medication Sig Start Date End Date Taking? Authorizing Provider  B Complex Vitamins (B COMPLEX 100 PO) Take 1 capsule by mouth daily.    [provider]  budesonide-formoterol (SYMBICORT) 160-4.5 MCG/ACT inhaler Inhale 2 puffs into the lungs 2 (two) times daily. 11/04/19   Tanda Rockers, MD  Cyanocobalamin (B-12 PO) Take 1 capsule by mouth daily.    [provider]  HYDROcodone-acetaminophen (NORCO/VICODIN) 5-325 MG tablet Take 1 tablet by mouth every 4 (four) hours  as needed. 11/27/19   Isla Pence, MD  loratadine (CLARITIN) 10 MG tablet Take 10 mg by mouth daily.    [provider]  LORazepam (ATIVAN) 1 MG tablet Take 1 mg by mouth every 4 (four) hours as needed for anxiety.    [provider]  Menaquinone-7 (VITAMIN K2 PO) Take 1 capsule by mouth every other day.    [provider]  Multiple Vitamin (MULTIVITAMIN) tablet Take 1 tablet by mouth daily.    [provider]  Multiple Vitamins-Minerals (ICAPS) CAPS Take 1 capsule by mouth daily.    [provider]  Omega-3 Fatty Acids (FISH OIL) 1000 MG CAPS Take 1,000 mg by mouth daily.    [provider]  predniSONE (DELTASONE) 10 MG tablet Take 10 mg by mouth daily as needed.     [provider]  Pyridoxine HCl (B-6 PO) Take 1 capsule by mouth daily.    [provider]  tadalafil (CIALIS) 10 MG tablet Take 10 mg by mouth daily as needed for erectile dysfunction.    [provider]  VITAMIN A PO Take 1 capsule by mouth every other  day.    [provider]  vitamin C (ASCORBIC ACID) 500 MG tablet Take 500 mg by mouth daily.    [provider]  Zinc 50 MG TABS Take by mouth.    [provider]  Zinc Sulfate (ZINC 15 PO) Take 15 mg by mouth daily.    [provider]    Allergies    Bee venom  Review of Systems   Review of Systems  Musculoskeletal:       Right wrist pain  All other systems reviewed and are negative.   Physical Exam Updated Vital Signs BP 126/76 (BP Location: Left Arm)   Pulse (!) 52   Temp 97.9 F (36.6 C) (Oral)   Ht 5\' 10"  (1.778 m)   Wt 95.3 kg   SpO2 96%   BMI 30.13 kg/m   Physical Exam Vitals and nursing note reviewed.  Constitutional:      Appearance: Normal appearance.  HENT:     Head: Normocephalic and atraumatic.     Right Ear: External ear normal.     Left Ear: External ear normal.     Nose: Nose normal.     Mouth/Throat:     Mouth: Mucous  membranes are moist.     Pharynx: Oropharynx is clear.  Eyes:     Extraocular Movements: Extraocular movements intact.     Conjunctiva/sclera: Conjunctivae normal.     Pupils: Pupils are equal, round, and reactive to light.  Cardiovascular:     Rate and Rhythm: Normal rate and regular rhythm.     Pulses: Normal pulses.     Heart sounds: Normal heart sounds.  Pulmonary:     Effort: Pulmonary effort is normal.     Breath sounds: Normal breath sounds.  Abdominal:     General: Abdomen is flat. Bowel sounds are normal.     Palpations: Abdomen is soft.  Musculoskeletal:     Right wrist: Deformity and tenderness present. Decreased range of motion.     Cervical back: Normal range of motion and neck supple.  Skin:    General: Skin is warm.     Capillary Refill: Capillary refill takes less than 2 seconds.     Comments: Small superficial abrasion on the palmar side of the forearm close to the ulna.  Not open fx.  Neurological:     General: No focal deficit present.     Mental Status: He is alert and oriented to person, place, and time.  Psychiatric:        Mood and Affect: Mood normal.        Behavior: Behavior normal.        Thought Content: Thought content normal.        Judgment: Judgment normal.     ED Results / Procedures / Treatments   Labs (all labs ordered are listed, but only abnormal results are displayed) Labs Reviewed - No data to display  EKG None  Radiology DG Wrist Complete Right  Result Date: 11/27/2019 CLINICAL DATA:  Fall with right wrist injury EXAM: RIGHT WRIST - COMPLETE 3+ VIEW COMPARISON:  None. FINDINGS: Comminuted intra-articular distal right radius fracture without significant displacement. No additional fractures. No dislocation. Mild healed deformity of the ulnar styloid. No suspicious focal osseous lesions. No radiopaque foreign bodies. Diffuse right wrist soft tissue swelling. IMPRESSION: Comminuted nondisplaced intra-articular distal right radius  fracture. No dislocation. Electronically Signed   By: Ilona Sorrel M.D.   On: 11/27/2019 16:07    Procedures .Splint Application  Date/Time: 11/27/2019 5:06 PM Performed by: Isla Pence, MD Authorized by: Isla Pence, MD   Consent:    Consent obtained:  Verbal   Consent given by:  Patient   Risks discussed:  Pain and swelling   Alternatives discussed:  No treatment Pre-procedure details:    Sensation:  Normal Procedure details:    Laterality:  Right   Location:  Wrist   Wrist:  R wrist   Splint type:  Sugar tong   Supplies:  Elastic bandage, Ortho-Glass and cotton padding Post-procedure details:    Pain:  Improved   Sensation:  Normal   Patient tolerance of procedure:  Tolerated well, no immediate complications   (including critical care time)  Medications Ordered in ED Medications  morphine 4 MG/ML injection 4 mg (4 mg Intramuscular Given 11/27/19 1627)  ondansetron (ZOFRAN-ODT) disintegrating tablet 4 mg (4 mg Oral Given 11/27/19 1626)    ED Course  I have reviewed the triage vital signs and the nursing notes.  Pertinent labs & imaging results that were available during my care of the patient were reviewed by me and considered in my medical decision making (see chart for details).    MDM Rules/Calculators/A&P                      Pt placed in a sugar tong splint.  He has seen Dr. Caralyn Guile in the past.  He wishes to f/u with him again.  Return if worse.    Final Clinical Impression(s) / ED Diagnoses Final diagnoses:  Closed fracture of right wrist, initial encounter    Rx / DC Orders ED Discharge Orders         Ordered    HYDROcodone-acetaminophen (NORCO/VICODIN) 5-325 MG tablet  Every 4 hours PRN     11/27/19 1704           Isla Pence, MD 11/27/19 1721

## 2019-11-27 NOTE — ED Notes (Signed)
Pt verbalized understanding of dc instructions.

## 2019-11-27 NOTE — ED Notes (Signed)
MD at bedside. 

## 2019-12-13 DIAGNOSIS — Z4789 Encounter for other orthopedic aftercare: Secondary | ICD-10-CM | POA: Insufficient documentation

## 2019-12-21 ENCOUNTER — Other Ambulatory Visit: Payer: Self-pay | Admitting: Internal Medicine

## 2019-12-22 ENCOUNTER — Telehealth: Payer: Self-pay | Admitting: Internal Medicine

## 2019-12-22 NOTE — Telephone Encounter (Signed)
Attempted to call pt but unable to reach and unable to leave a VM. Will try to call back later. 

## 2019-12-23 MED ORDER — BUDESONIDE-FORMOTEROL FUMARATE 160-4.5 MCG/ACT IN AERO: 2.0000 | INHALATION_SPRAY | Freq: Two times a day (BID) | RESPIRATORY_TRACT | 6 refills | Status: DC

## 2019-12-23 NOTE — Telephone Encounter (Signed)
Patient has verified he will be in office tomorrow 12/24/19 for appointment. I also informed him that I sent in Rx for Symbicort.  Nothing further needed at this time

## 2019-12-24 ENCOUNTER — Other Ambulatory Visit: Payer: Self-pay

## 2019-12-24 ENCOUNTER — Encounter: Payer: Self-pay | Admitting: Internal Medicine

## 2019-12-24 ENCOUNTER — Ambulatory Visit: Payer: Medicare Other | Admitting: Internal Medicine

## 2019-12-24 DIAGNOSIS — J449 Chronic obstructive pulmonary disease, unspecified: Secondary | ICD-10-CM

## 2019-12-24 NOTE — Patient Instructions (Addendum)
Ok to discuss the lung cancer screening program with Dr Drema Dallas at your next visit      .Please schedule a follow up visit in 8  months but call sooner if needed

## 2019-12-24 NOTE — Progress Notes (Signed)
Scott Ho, male    DOB: 09/30/1946     MRN: [id number]     Brief patient profile:  78 yowm MM with h/o RA since his 46's controls with prn prednisone   quit smoking 2014 with  some cough that resolved and bothered more by nasal congestion since around 2017 which is  no better with otc's or nasal but never seen by ent / allergy and underwent  LDSCT 08/19/18 which suggested bronchiolitis/ copd changes so referred to pulmonary clinic 09/08/2018 by Dr   Drema Dallas and proved to have GOLD III criteria 10/23/2018    History of Present Illness  09/08/2018 Pulmonary/ 1st office eval/ Corrin Sieling  Chief Complaint  Patient presents with  . Consult    Former smoker, abnormal CT, states he has occasional SOB denies chest discomfort or cough. States he just wanted to make sure he does not have cancer.   Dyspnea:  1.4 miles in 28 minutes around neighborhood includes some hills s limiting sob / more limited by arthritis typically than sob Cough: am congestion min white mucus  Sleep: no problem flat/ 2 pillows  Previously needed pred 5 mg "daily" for RA but since around 2014 not needing regularly  rec No change rx     10/23/2018  f/u ov/Jakeb Lamping re: copd GOLD III  Chief Complaint  Patient presents with  . Follow-up    PFT's done today. His breathing is unchanged.   Dyspnea:  MMRC2 = can't walk a nl pace on a flat grade s sob but does fine slow and flat  Cough: some pnds /hoarsness x 1.5 y more of daytime issue s excess mucus  Sleeping: bed flat/ 2 pillows / on side   rec Symbicort 160 Take 2 puffs first thing in am and then another 2 puffs about 12 hours later.  Work on Doctor, hospital.    01/26/2019  f/u ov/Fordyce Lepak re: GOLD III copd / symbicort 160 2bid  And RA and has pred so use prn very rarely  Chief Complaint  Patient presents with  . Follow-up    Breathing is overall doing well. No new co's.   Dyspnea:  MMRC1 = can walk nl pace, flat grade, can't hurry or go uphills or steps s sob   Cough:  no Sleeping: 2 pillows on side  SABA use: no 02: none  rec Work on inhaler technique   12/24/2019  f/u ov/Effrey Davidow re:  GOLD III/ symb 160 2 bid / no prednisone lately  Chief Complaint  Patient presents with  . Follow-up    Pt states he has been doing well since last visit and denies any complaints with his breathing.   Dyspnea:  Walking neighborhood 30 min to 1h until fx R wrist  Cough: sometimes with nasal congestion, better with clariton  Sleeping: on side 2 pillows  SABA use: none  02: none    No obvious day to day or daytime variability or assoc excess/ purulent sputum or mucus plugs or hemoptysis or cp or chest tightness, subjective wheeze or overt sinus or hb symptoms.   Sleeping as above without nocturnal  or early am exacerbation  of respiratory  c/o's or need for noct saba. Also denies any obvious fluctuation of symptoms with weather or environmental changes or other aggravating or alleviating factors except as outlined above   No unusual exposure hx or h/o childhood pna/ asthma or knowledge of premature birth.  Current Allergies, Complete Past Medical History, Past Surgical History, Family History, and  Social History were reviewed in [employer] record.  ROS  The following are not active complaints unless bolded Hoarseness, sore throat, dysphagia, dental problems, itching, sneezing,  nasal congestion or discharge of excess mucus or purulent secretions, ear ache,   fever, chills, sweats, unintended wt loss or wt gain, classically pleuritic or exertional cp,  orthopnea pnd or arm/hand swelling  or leg swelling, presyncope, palpitations, abdominal pain, anorexia, nausea, vomiting, diarrhea  or change in bowel habits or change in bladder habits, change in stools or change in urine, dysuria, hematuria,  rash, arthralgias, visual complaints, headache, numbness, weakness or ataxia or problems with walking or coordination,  change in mood or  memory.        Current  Meds  Medication Sig  . B Complex Vitamins (B COMPLEX 100 PO) Take 1 capsule by mouth daily.  . budesonide-formoterol (SYMBICORT) 160-4.5 MCG/ACT inhaler Inhale 2 puffs into the lungs 2 (two) times daily.  . Cyanocobalamin (B-12 PO) Take 1 capsule by mouth daily.  Marland Kitchen HYDROcodone-acetaminophen (NORCO/VICODIN) 5-325 MG tablet Take 1 tablet by mouth every 4 (four) hours as needed.  . loratadine (CLARITIN) 10 MG tablet Take 10 mg by mouth daily.  Marland Kitchen LORazepam (ATIVAN) 1 MG tablet Take 1 mg by mouth every 4 (four) hours as needed for anxiety.  . Menaquinone-7 (VITAMIN K2 PO) Take 1 capsule by mouth every other day.  . Multiple Vitamin (MULTIVITAMIN) tablet Take 1 tablet by mouth daily.  . Multiple Vitamins-Minerals (ICAPS) CAPS Take 1 capsule by mouth daily.  . Omega-3 Fatty Acids (FISH OIL) 1000 MG CAPS Take 1,000 mg by mouth daily.  . predniSONE (DELTASONE) 10 MG tablet Take 10 mg by mouth daily as needed.   . Pyridoxine HCl (B-6 PO) Take 1 capsule by mouth daily.  . tadalafil (CIALIS) 10 MG tablet Take 10 mg by mouth daily as needed for erectile dysfunction.  Marland Kitchen VITAMIN A PO Take 1 capsule by mouth every other day.  . vitamin C (ASCORBIC ACID) 500 MG tablet Take 500 mg by mouth daily.  . Zinc 50 MG TABS Take by mouth.  . Zinc Sulfate (ZINC 15 PO) Take 15 mg by mouth daily.   Current Facility-Administered Medications for the 12/24/19 encounter (Office Visit) with Tanda Rockers, MD  Medication  . 0.9 %  sodium chloride infusion                   Objective:       12/24/2019          220  01/26/2019        213    09/08/18 200 lb (90.7 kg)  07/15/18 199 lb (90.3 kg)  07/01/18 199 lb 6.4 oz (90.4 kg)    Pleasant amb wm nad    HEENT : pt wearing mask not removed for exam due to covid - 19 concerns.    NECK :  without JVD/Nodes/TM/ nl carotid upstrokes bilaterally   LUNGS: no acc muscle use,  Mild barrel  contour chest wall with bilateral  Distant bs s audible wheeze and  without  cough on insp or exp maneuvers  and mild  Hyperresonant  to  percussion bilaterally     CV:  RRR  no s3 or murmur or increase in P2, and no edema   ABD:  soft and nontender with pos end  insp Hoover's  in the supine position. No bruits or organomegaly appreciated, bowel sounds nl  MS:   Nl gait/  ext warm with RA hand deformities, calf tenderness, cyanosis or clubbing R wrist /forearm in splint   SKIN: warm and dry without lesions    NEURO:  alert, approp, nl sensorium with  no motor or cerebellar deficits apparent.                    Assessment

## 2019-12-26 ENCOUNTER — Encounter: Payer: Self-pay | Admitting: Internal Medicine

## 2019-12-26 NOTE — Assessment & Plan Note (Signed)
Quit smoking 2014 but also has RA Spirometry 09/08/2018  FEV1 1.0 (30%)  Ratio 56 with classic curvature   On no rx    - PFT's  10/23/2018  FEV1 1.25  (40 % ) ratio 49  p 14 % improvement from saba p nothing prior to study with DLCO  81 % corrects to 124 % for alv volume   - Alpha One AT screening 10/23/2018   MM level 139  - 01/26/2019  After extensive coaching inhaler device,  effectiveness =    75%   Adequate control on present rx, reviewed in detail with pt > no change in rx needed    Discussed Lung cancer screening :   Low-dose CT lung cancer screening is recommended for patients who are 28-12 years of age with a 30+ pack-year history of smoking, and who are currently smoking or quit <=15 years ago.   He fits this criteria though with RA higher likelihood for false positives/ advised. Since his PCP had started the program ok with me to discuss it with Dr Drema Dallas or alternatively refer to Eric Form NP here who runs our program and does all the informed decision making process.    Pt informed of the seriousness of COVID 19 infection as a direct risk to lung health  and safey and to close contacts and should continue to wear a facemask in public and minimize exposure to public locations but especially avoid any area or activity where non-close contacts are not observing distancing or wearing an appropriate face mask.  I strongly recommended vaccine when offered.           Each maintenance medication was reviewed in detail including emphasizing most importantly the difference between maintenance and prns and under what circumstances the prns are to be triggered using an action plan format where appropriate.  Total time for H and P, chart review, counseling, teaching device and generating customized AVS unique to this office visit / charting = 20 mim

## 2020-01-11 ENCOUNTER — Ambulatory Visit: Payer: Medicare Other

## 2020-01-20 ENCOUNTER — Ambulatory Visit
Admission: RE | Admit: 2020-01-20 | Discharge: 2020-01-20 | Disposition: A | Discharge status: Home / Self Care | Payer: Medicare Other | Source: Ambulatory Visit | Attending: General Practice | Admitting: General Practice

## 2020-01-20 ENCOUNTER — Other Ambulatory Visit: Payer: Self-pay | Admitting: General Practice

## 2020-01-20 ENCOUNTER — Other Ambulatory Visit: Payer: Self-pay

## 2020-01-20 DIAGNOSIS — T189XXA Foreign body of alimentary tract, part unspecified, initial encounter: Secondary | ICD-10-CM

## 2020-08-17 ENCOUNTER — Other Ambulatory Visit: Payer: Self-pay | Admitting: Internal Medicine

## 2020-08-22 ENCOUNTER — Ambulatory Visit: Payer: Medicare Other | Admitting: Internal Medicine

## 2020-08-28 ENCOUNTER — Ambulatory Visit: Payer: Medicare Other | Admitting: Internal Medicine

## 2020-08-28 ENCOUNTER — Other Ambulatory Visit: Payer: Self-pay

## 2020-08-28 ENCOUNTER — Encounter: Payer: Self-pay | Admitting: Internal Medicine

## 2020-08-28 VITALS — BP 126/74 | HR 74 | Temp 97.5°F | Ht 70.25 in | Wt 178.6 lb

## 2020-08-28 DIAGNOSIS — R9389 Abnormal findings on diagnostic imaging of other specified body structures: Secondary | ICD-10-CM

## 2020-08-28 DIAGNOSIS — J449 Chronic obstructive pulmonary disease, unspecified: Secondary | ICD-10-CM | POA: Diagnosis not present

## 2020-08-28 NOTE — Progress Notes (Signed)
Scott Ho, male    DOB: 19-May-1946     MRN: [id number]    Brief patient profile:  38 yowm MM/GOLD III copd  with h/o RA since his 67's controls with prn prednisone   quit smoking 2014 with  some cough that resolved and bothered more by nasal congestion since around 2017 which is  no better with otc's or nasal but never seen by ent / allergy and underwent  LDSCT 08/19/18 which suggested bronchiolitis/ copd changes so referred to pulmonary clinic 09/08/2018 by Dr   Drema Dallas and proved to have GOLD III criteria 10/23/2018    History of Present Illness  09/08/2018 Pulmonary/ 1st office eval/ Zenda Herskowitz  Chief Complaint  Patient presents with  . Consult    Former smoker, abnormal CT, states he has occasional SOB denies chest discomfort or cough. States he just wanted to make sure he does not have cancer.   Dyspnea:  1.4 miles in 28 minutes around neighborhood includes some hills s limiting sob / more limited by arthritis typically than sob Cough: am congestion min white mucus  Sleep: no problem flat/ 2 pillows  Previously needed pred 5 mg "daily" for RA but since around 2014 not needing regularly  rec No change rx     10/23/2018  f/u ov/Scott Ho re: copd GOLD III  Chief Complaint  Patient presents with  . Follow-up    PFT's done today. His breathing is unchanged.   Dyspnea:  MMRC2 = can't walk a nl pace on a flat grade s sob but does fine slow and flat  Cough: some pnds /hoarsness x 1.5 y more of daytime issue s excess mucus  Sleeping: bed flat/ 2 pillows / on side   rec Symbicort 160 Take 2 puffs first thing in am and then another 2 puffs about 12 hours later.  Work on Doctor, hospital.    01/26/2019  f/u ov/Scott Ho re: GOLD III copd / symbicort 160 2bid  And RA and has pred so use prn very rarely  Chief Complaint  Patient presents with  . Follow-up    Breathing is overall doing well. No new co's.   Dyspnea:  MMRC1 = can walk nl pace, flat grade, can't hurry or go uphills or steps s sob     Cough: no Sleeping: 2 pillows on side  SABA use: no 02: none  rec Work on inhaler technique   12/24/2019  f/u ov/Scott Ho re:  GOLD III/ symb 160 2 bid / no prednisone lately  Chief Complaint  Patient presents with  . Follow-up    Pt states he has been doing well since last visit and denies any complaints with his breathing.   Dyspnea:  Walking neighborhood 30 min to 1h until fx R wrist  Cough: sometimes with nasal congestion, better with clariton  Sleeping: on side 2 pillows  SABA use: none  02: none  rec Ok to discuss the lung cancer screening program with Dr Drema Dallas at your next visit    08/28/2020  f/u ov/Scott Ho re: RA  Plus GOLD III/ symb 160 2bid / last pred x years prior to [place] Complaint  Patient presents with  . Follow-up    Denies an breathing problems  Dyspnea:  Walking an hour some hills no problem p 2 pffs each am symbicort 160  Cough: none/ just sometimes daytime urge to clear the throat s mucus production  Sleeping: bed is flat/ 2 pillows  SABA use: none  02: none  No obvious day to day or daytime variability or assoc excess/ purulent sputum or mucus plugs or hemoptysis or cp or chest tightness, subjective wheeze or overt sinus or hb symptoms.   Sleeping  without nocturnal  or early am exacerbation  of respiratory  c/o's or need for noct saba. Also denies any obvious fluctuation of symptoms with weather or environmental changes or other aggravating or alleviating factors except as outlined above   No unusual exposure hx or h/o childhood pna/ asthma or knowledge of premature birth.  Current Allergies, Complete Past Medical History, Past Surgical History, Family History, and Social History were reviewed in [employer] record.  ROS  The following are not active complaints unless bolded Hoarseness, sore throat, dysphagia, dental problems, itching, sneezing,  nasal congestion or discharge of excess mucus or purulent secretions, ear ache,    fever, chills, sweats, unintended wt loss (atkins diet)  or wt gain, classically pleuritic or exertional cp,  orthopnea pnd or arm/hand swelling  or leg swelling, presyncope, palpitations, abdominal pain, anorexia, nausea, vomiting, diarrhea  or change in bowel habits or change in bladder habits, change in stools or change in urine, dysuria, hematuria,  rash, arthralgias/ neck muscle pains , visual complaints, headache, numbness, weakness or ataxia or problems with walking or coordination,  change in mood or  memory.        Current Meds  Medication Sig  . B Complex Vitamins (B COMPLEX 100 PO) Take 1 capsule by mouth daily.  . Cyanocobalamin (B-12 PO) Take 1 capsule by mouth daily.  Marland Kitchen HYDROcodone-acetaminophen (NORCO/VICODIN) 5-325 MG tablet Take 1 tablet by mouth every 4 (four) hours as needed.  . loratadine (CLARITIN) 10 MG tablet Take 10 mg by mouth daily.  Marland Kitchen LORazepam (ATIVAN) 1 MG tablet Take 1 mg by mouth every 4 (four) hours as needed for anxiety.  . Menaquinone-7 (VITAMIN K2 PO) Take 1 capsule by mouth every other day.  . Multiple Vitamin (MULTIVITAMIN) tablet Take 1 tablet by mouth daily.  . Multiple Vitamins-Minerals (ICAPS) CAPS Take 1 capsule by mouth daily.  . Omega-3 Fatty Acids (FISH OIL) 1000 MG CAPS Take 1,000 mg by mouth daily.  . predniSONE (DELTASONE) 10 MG tablet Take 10 mg by mouth daily as needed.   . Pyridoxine HCl (B-6 PO) Take 1 capsule by mouth daily.  . SYMBICORT 160-4.5 MCG/ACT inhaler INHALE 2 PUFFS INTO THE LUNGS TWICE DAILY  . tadalafil (CIALIS) 10 MG tablet Take 10 mg by mouth daily as needed for erectile dysfunction.  Marland Kitchen VITAMIN A PO Take 1 capsule by mouth every other day.  . vitamin C (ASCORBIC ACID) 500 MG tablet Take 500 mg by mouth daily.  . Zinc 50 MG TABS Take by mouth.  . Zinc Sulfate (ZINC 15 PO) Take 15 mg by mouth daily.   Current Facility-Administered Medications for the 08/28/20 encounter (Office Visit) with Tanda Rockers, MD  Medication  . 0.9  %  sodium chloride infusion                     Objective:     08/28/2020      178  12/24/2019          220  01/26/2019        213    09/08/18 200 lb (90.7 kg)  07/15/18 199 lb (90.3 kg)  07/01/18 199 lb 6.4 oz (90.4 kg)     Vital signs reviewed  08/28/2020  - Note at rest  02 sats  97% on RA      HEENT : pt wearing mask not removed for exam due to covid - 19 concerns.   NECK :  without JVD/Nodes/TM/ nl carotid upstrokes bilaterally   LUNGS: no acc muscle use,  Min barrel  contour chest wall with bilateral  slightly decreased bs s audible wheeze and  without cough on insp or exp maneuvers and min  Hyperresonant  to  percussion bilaterally     CV:  RRR  no s3 or murmur or increase in P2, and no edema   ABD:  soft and nontender with pos end  insp Hoover's  in the supine position. No bruits or organomegaly appreciated, bowel sounds nl  MS:   Nl gait/  ext warm without deformities, calf tenderness, cyanosis or clubbing No obvious joint restrictions   SKIN: warm and dry without lesions    NEURO:  alert, approp, nl sensorium with  no motor or cerebellar deficits apparent.              Assessment

## 2020-08-28 NOTE — Patient Instructions (Signed)
We will set you up for a shared decision making televist with our NP Anselm Lis   Symbicort 160 up to 2 puffs every 12 hours  As needed  Do neck stretching exercises x 2 reps 4 x daily   Please schedule a follow up visit in 12 months but call sooner if needed

## 2020-08-29 ENCOUNTER — Encounter: Payer: Self-pay | Admitting: Internal Medicine

## 2020-08-29 NOTE — Addendum Note (Signed)
Addended by: Luanna Salk on: 08/29/2020 10:00 AM   Modules accepted: Orders

## 2020-08-29 NOTE — Assessment & Plan Note (Signed)
He has mild MAI patter previously but based on smoking hx is eligible to resume low dose screening > referred back to our NP for early decision making

## 2020-08-29 NOTE — Assessment & Plan Note (Signed)
Quit smoking 2014 but also has RA Spirometry 09/08/2018  FEV1 1.0 (30%)  Ratio 56 with classic curvature   On no rx    - PFT's  10/23/2018  FEV1 1.25  (40 % ) ratio 49  p 14 % improvement from saba p nothing prior to study with DLCO  81 % corrects to 124 % for alv volume   - Alpha One AT screening 10/23/2018   MM level 139  - 08/28/2020  After extensive coaching inhaler device,  effectiveness =    75%   He's doing much better since intentional wt loss on atkins plan and finding he does not need the symbicort 160 at max doses which is fine given the rapid onset of action should he decide he needs max dosing ie flare of symptoms or need for saba neither of which apply in past year.          Each maintenance medication was reviewed in detail including emphasizing most importantly the difference between maintenance and prns and under what circumstances the prns are to be triggered using an action plan format where appropriate.  Total time for H and P, chart review, counseling, teaching device and generating customized AVS unique to this office visit / charting = 25 min

## 2020-09-13 ENCOUNTER — Telehealth: Payer: Self-pay | Admitting: Acute Care

## 2020-09-13 DIAGNOSIS — Z87891 Personal history of nicotine dependence: Secondary | ICD-10-CM

## 2020-09-13 NOTE — Telephone Encounter (Signed)
Lm for patient.  

## 2020-09-15 NOTE — Telephone Encounter (Signed)
Spoke with pt and scheduled SDMV 11/17/ 21 CT ordered Nothing further needed

## 2020-10-03 ENCOUNTER — Other Ambulatory Visit (HOSPITAL_COMMUNITY): Payer: Self-pay | Admitting: Family Medicine

## 2020-10-03 DIAGNOSIS — R2242 Localized swelling, mass and lump, left lower limb: Secondary | ICD-10-CM

## 2020-10-04 ENCOUNTER — Ambulatory Visit
Admission: RE | Admit: 2020-10-04 | Discharge: 2020-10-04 | Disposition: A | Discharge status: Home / Self Care | Payer: Medicare Other | Source: Ambulatory Visit | Attending: Acute Care | Admitting: Acute Care

## 2020-10-04 ENCOUNTER — Other Ambulatory Visit: Payer: Self-pay

## 2020-10-04 ENCOUNTER — Ambulatory Visit (HOSPITAL_COMMUNITY)
Admission: RE | Admit: 2020-10-04 | Discharge: 2020-10-04 | Disposition: A | Discharge status: Home / Self Care | Payer: Medicare Other | Source: Ambulatory Visit | Attending: Family Medicine | Admitting: Family Medicine

## 2020-10-04 ENCOUNTER — Encounter: Payer: Self-pay | Admitting: Acute Care

## 2020-10-04 ENCOUNTER — Ambulatory Visit (INDEPENDENT_AMBULATORY_CARE_PROVIDER_SITE_OTHER): Payer: Medicare Other | Admitting: Acute Care

## 2020-10-04 VITALS — BP 120/72 | HR 74 | Temp 98.0°F | Ht 69.5 in | Wt 177.2 lb

## 2020-10-04 DIAGNOSIS — Z122 Encounter for screening for malignant neoplasm of respiratory organs: Secondary | ICD-10-CM

## 2020-10-04 DIAGNOSIS — R2242 Localized swelling, mass and lump, left lower limb: Secondary | ICD-10-CM | POA: Diagnosis not present

## 2020-10-04 DIAGNOSIS — F17210 Nicotine dependence, cigarettes, uncomplicated: Secondary | ICD-10-CM

## 2020-10-04 DIAGNOSIS — Z87891 Personal history of nicotine dependence: Secondary | ICD-10-CM

## 2020-10-04 NOTE — Patient Instructions (Signed)
Thank you for participating in the [facility] Scre[facility]. It was our pleasure to meet you today. We will call you with the results of your scan within the next few days. Your scan will be assigned a Lung RADS category score by the physicians reading the scans.  This Lung RADS score determines follow up scanning.  See below for description of categories, and follow up screening recommendations. We will be in touch to schedule your follow up screening annually or based on recommendations of our providers. We will fax a copy of your scan results to your Primary Care Physician, or the physician who referred you to the program, to ensure they have the results. Please call the office if you have any questions or concerns regarding your scanning experience or results.  Our office number is [phone]. Please speak with Denise Phelps, RN. She is our Lung Cancer Screening RN. If she is unavailable when you call, please have the office staff send her a message. She will return your call at her earliest convenience. Remember, if your scan is normal, we will scan you annually as long as you continue to meet the criteria for the program. (Age 55-77, Current smoker or smoker who has quit within the last 15 years). If you are a smoker, remember, quitting is the single most powerful action that you can take to decrease your risk of lung cancer and other pulmonary, breathing related problems. We know quitting is hard, and we are here to help.  Please let us know if there is anything we can do to help you meet your goal of quitting. If you are a former smoker, congratulations. We are proud of you! Remain smoke free! Remember you can refer friends or family members through the number above.  We will screen them to make sure they meet criteria for the program. Thank you for helping us take better care of you by participating in Lung Screening.  Lung RADS Categories:  Lung RADS 1: no nodules  or definitely non-concerning nodules.  Recommendation is for a repeat annual scan in 12 months.  Lung RADS 2:  nodules that are non-concerning in appearance and behavior with a very low likelihood of becoming an active cancer. Recommendation is for a repeat annual scan in 12 months.  Lung RADS 3: nodules that are probably non-concerning , includes nodules with a low likelihood of becoming an active cancer.  Recommendation is for a 6-month repeat screening scan. Often noted after an upper respiratory illness. We will be in touch to make sure you have no questions, and to schedule your 6-month scan.  Lung RADS 4 A: nodules with concerning findings, recommendation is most often for a follow up scan in 3 months or additional testing based on our provider's assessment of the scan. We will be in touch to make sure you have no questions and to schedule the recommended 3 month follow up scan.  Lung RADS 4 B:  indicates findings that are concerning. We will be in touch with you to schedule additional diagnostic testing based on our provider's  assessment of the scan.   

## 2020-10-04 NOTE — Progress Notes (Signed)
Shared Decision Making Visit Lung Cancer Screening Program [phone])   Eligibility:  Age 74 y.o.  Pack Years Smoking History Calculation  (# packs/per year x # years smoked)  Recent History of coughing up blood  no  Unexplained weight loss? no ( >Than 15 pounds within the last 6 months )  Prior History Lung / other cancer no (Diagnosis within the last 5 years already requiring surveillance chest CT Scans).  Smoking Status Former Smoker  Former Smokers: Years since quit: 6 years  Quit Date: 2015  Visit Components:  Discussion included one or more decision making aids. yes  Discussion included risk/benefits of screening. yes  Discussion included potential follow up diagnostic testing for abnormal scans. yes  Discussion included meaning and risk of over diagnosis. yes  Discussion included meaning and risk of False Positives. yes  Discussion included meaning of total radiation exposure. yes  Counseling Included:  Importance of adherence to annual lung cancer LDCT screening. yes  Impact of comorbidities on ability to participate in the program. yes  Ability and willingness to under diagnostic treatment. yes  Smoking Cessation Counseling:  Current Smokers:   Discussed importance of smoking cessation. yes  Information about tobacco cessation classes and interventions provided to patient. yes  Patient provided with "ticket" for LDCT Scan. yes  Symptomatic Patient. no  CounselingNA  Diagnosis Code: Tobacco Use Z72.0  Asymptomatic Patient yes  Counseling (Intermediate counseling: > three minutes counseling) Q6578  Former Smokers:   Discussed the importance of maintaining cigarette abstinence. yes  Diagnosis Code: Personal History of Nicotine Dependence. I69.629  Information about tobacco cessation classes and interventions provided to patient. Yes  Patient provided with "ticket" for LDCT Scan. yes  Written Order for Lung Cancer Screening with LDCT placed  in Epic. Yes (CT Chest Lung Cancer Screening Low Dose W/O CM) [id number] Z12.2-Screening of respiratory organs Z87.891-Personal history of nicotine dependence  BP 120/72 (BP Location: Left Arm, Cuff Size: Normal)   Pulse 74   Temp 98 F (36.7 C) (Oral)   Ht 5' 9.5" (1.765 m)   Wt 177 lb 3.2 oz (80.4 kg)   SpO2 97%   BMI 25.79 kg/m    I spent 25 minutes of face to face time with Scott Ho discussing the risks and benefits of lung cancer screening. We viewed a power point together that explained in detail the above noted topics. We took the time to pause the power point at intervals to allow for questions to be asked and answered to ensure understanding. We discussed that he had taken the single most powerful action possible to decrease his risk of developing lung cancer when he quit smoking. I counseled him to remain smoke free, and to contact me if he ever had the desire to smoke again so that I can provide resources and tools to help support the effort to remain smoke free. We discussed the time and location of the scan, and that either  Doroteo Glassman RN or I will call with the results within  24-48 hours of receiving them. He has my card and contact information in the event he needs to speak with me, in addition to a copy of the power point we reviewed as a resource. He verbalized understanding of all of the above and had no further questions upon leaving the office.     I explained to the patient that there has been a high incidence of coronary artery disease noted on these exams. I explained that this is a  non-gated exam therefore degree or severity cannot be determined. This patient is not on statin therapy. I have asked the patient to follow-up with their PCP regarding any incidental finding of coronary artery disease and management with diet or medication as they feel is clinically indicated. The patient verbalized understanding of the above and had no further questions.     Scott Spatz, NP 10/04/2020 11:11 AM ,

## 2020-10-05 NOTE — Progress Notes (Signed)
Please call patient and let them  know their  low dose Ct was read as a Lung RADS 2: nodules that are benign in appearance and behavior with a very low likelihood of becoming a clinically active cancer due to size or lack of growth. Recommendation per radiology is for a repeat LDCT in 12 months. .Please let them  know we will order and schedule their  annual screening scan for 09/2021.  Pt.  is not  currently on statin therapy. Please place order for annual  screening scan for  09/2021 and fax results to PCP. Thanks so much.

## 2020-10-11 ENCOUNTER — Other Ambulatory Visit: Payer: Self-pay | Admitting: *Deleted

## 2020-10-11 DIAGNOSIS — Z87891 Personal history of nicotine dependence: Secondary | ICD-10-CM

## 2021-07-09 ENCOUNTER — Ambulatory Visit: Payer: Medicare Other | Admitting: Internal Medicine

## 2021-07-09 ENCOUNTER — Other Ambulatory Visit: Payer: Self-pay

## 2021-07-09 ENCOUNTER — Encounter: Payer: Self-pay | Admitting: Internal Medicine

## 2021-07-09 DIAGNOSIS — R9389 Abnormal findings on diagnostic imaging of other specified body structures: Secondary | ICD-10-CM | POA: Diagnosis not present

## 2021-07-09 DIAGNOSIS — J449 Chronic obstructive pulmonary disease, unspecified: Secondary | ICD-10-CM | POA: Diagnosis not present

## 2021-07-09 NOTE — Assessment & Plan Note (Signed)
Low-dose CT lung cancer screening is recommended for patients who are 67-75 years of age with a 30+ pack-year history of smoking, and who are currently smoking or quit <=15 years ago.   Advised to continue program but since will be in [place] starting 8/23 will need to either do the 09/2021 study there or wait until his return here - since the last 2 were fine delaying x 6 m is reasonable if can't be done there.          Each maintenance medication was reviewed in detail including emphasizing most importantly the difference between maintenance and prns and under what circumstances the prns are to be triggered using an action plan format where appropriate.  Total time for H and P, chart review, counseling, reviewing hfa  device(s) and generating customized AVS unique to this office visit / same day charting =20 min

## 2021-07-09 NOTE — Progress Notes (Addendum)
Scott Ho, male    DOB: [birth year]     MRN: [id number]    Brief patient profile:  36 yowm MM/GOLD III copd  with h/o RA since his 74's controls with prn prednisone   quit smoking 2014 with  some cough that resolved and bothered more by nasal congestion since around 2017 which is  no better with otc's or nasal but never seen by ent / allergy and underwent  LDSCT 08/19/18 which suggested bronchiolitis/ copd changes so referred to pulmonary clinic 09/08/2018 by Dr   Drema Dallas and proved to have GOLD III criteria 10/23/2018    History of Present Illness  09/08/2018 Pulmonary/ 1st office eval/ Mahonri Seiden  Chief Complaint  Patient presents with   Consult    Former smoker, abnormal CT, states he has occasional SOB denies chest discomfort or cough. States he just wanted to make sure he does not have cancer.   Dyspnea:  1.4 miles in 28 minutes around neighborhood includes some hills s limiting sob / more limited by arthritis typically than sob Cough: am congestion min white mucus  Sleep: no problem flat/ 2 pillows  Previously needed pred 5 mg "daily" for RA but since around 2014 not needing regularly  rec No change rx     10/23/2018  f/u ov/Kameshia Madruga re: copd GOLD III  Chief Complaint  Patient presents with   Follow-up    PFT's done today. His breathing is unchanged.   Dyspnea:  MMRC2 = can't walk a nl pace on a flat grade s sob but does fine slow and flat  Cough: some pnds /hoarsness x 1.5 y more of daytime issue s excess mucus  Sleeping: bed flat/ 2 pillows / on side   rec Symbicort 160 Take 2 puffs first thing in am and then another 2 puffs about 12 hours later.  Work on Doctor, hospital.    01/26/2019  f/u ov/Kaijah Abts re: GOLD III copd / symbicort 160 2bid  And RA and has pred so use prn very rarely  Chief Complaint  Patient presents with   Follow-up    Breathing is overall doing well. No new co's.   Dyspnea:  MMRC1 = can walk nl pace, flat grade, can't hurry or go uphills or steps s sob    Cough: no Sleeping: 2 pillows on side  SABA use: no 02: none  rec Work on inhaler technique   12/24/2019  f/u ov/Yanel Dombrosky re:  GOLD III/ symb 160 2 bid / no prednisone lately  Chief Complaint  Patient presents with   Follow-up    Pt states he has been doing well since last visit and denies any complaints with his breathing.   Dyspnea:  Walking neighborhood 30 min to 1h until fx R wrist  Cough: sometimes with nasal congestion, better with clariton  Sleeping: on side 2 pillows  SABA use: none  02: none  rec Ok to discuss the lung cancer screening program with Dr Drema Dallas at your next visit    08/28/2020  f/u ov/Chiante Peden re: RA  Plus GOLD III/ symb 160 2bid / last pred x years prior to [place] Complaint  Patient presents with   Follow-up    Denies an breathing problems  Dyspnea:  Walking an hour some hills no problem p 2 pffs each am symbicort 160  Cough: none/ just sometimes daytime urge to clear the throat s mucus production  Sleeping: bed is flat/ 2 pillows  SABA use: none  02: none  Rec  Symbicort 160 up to 2 puffs every 12 hours  As needed Do neck stretching exercises x 2 reps 4 x daily  Please schedule a follow up visit in 12 months but call sooner if needed    07/09/2021  f/u ov/Shanequia Kendrick re: copd GOLD III    maint on symbicort 160 2 puffs each am  only, no RA symptoms Chief Complaint  Patient presents with   Follow-up    Breathing is overall doing well. He uses symbicort once daily while here in [place] but none when in [place].    Dyspnea: still walking up to an hour s stopping with some hills  Cough: none  Sleeping: no resp cc flat, one or two pillows SABA use: none  02: none  Covid status: x 4 vax / never infected    No obvious day to day or daytime variability or assoc excess/ purulent sputum or mucus plugs or hemoptysis or cp or chest tightness, subjective wheeze or overt sinus or hb symptoms.   Sleeping  without nocturnal  or early am exacerbation  of respiratory  c/o's  or need for noct saba. Also denies any obvious fluctuation of symptoms with weather or environmental changes or other aggravating or alleviating factors except as outlined above   No unusual exposure hx or h/o childhood pna/ asthma or knowledge of premature birth.  Current Allergies, Complete Past Medical History, Past Surgical History, Family History, and Social History were reviewed in [employer] record.  ROS  The following are not active complaints unless bolded Hoarseness, sore throat, dysphagia, dental problems, itching, sneezing,  nasal congestion or discharge of excess mucus or purulent secretions, ear ache,   fever, chills, sweats, unintended wt loss or wt gain, classically pleuritic or exertional cp,  orthopnea pnd or arm/hand swelling  or leg swelling, presyncope, palpitations, abdominal pain, anorexia, nausea, vomiting, diarrhea  or change in bowel habits or change in bladder habits, change in stools or change in urine, dysuria, hematuria,  rash, arthralgias, visual complaints, headache, numbness, weakness or ataxia or problems with walking or coordination,  change in mood or  memory.        Current Meds  Medication Sig   B Complex Vitamins (B COMPLEX 100 PO) Take 1 capsule by mouth daily.   Cyanocobalamin (B-12 PO) Take 1 capsule by mouth daily.   HYDROcodone-acetaminophen (NORCO/VICODIN) 5-325 MG tablet Take 1 tablet by mouth every 4 (four) hours as needed.   loratadine (CLARITIN) 10 MG tablet Take 10 mg by mouth daily.   LORazepam (ATIVAN) 1 MG tablet Take 1 mg by mouth every 4 (four) hours as needed for anxiety.   Menaquinone-7 (VITAMIN K2 PO) Take 1 capsule by mouth every other day.   Multiple Vitamin (MULTIVITAMIN) tablet Take 1 tablet by mouth daily.   Multiple Vitamins-Minerals (ICAPS) CAPS Take 1 capsule by mouth daily.   Omega-3 Fatty Acids (FISH OIL) 1000 MG CAPS Take 1,000 mg by mouth daily.   Pyridoxine HCl (B-6 PO) Take 1 capsule by mouth daily.    SYMBICORT 160-4.5 MCG/ACT inhaler INHALE 2 PUFFS INTO THE LUNGS TWICE DAILY   tadalafil (CIALIS) 10 MG tablet Take 10 mg by mouth daily as needed for erectile dysfunction.   VITAMIN A PO Take 1 capsule by mouth every other day.   vitamin C (ASCORBIC ACID) 500 MG tablet Take 500 mg by mouth daily.   Zinc 50 MG TABS Take by mouth.   Zinc Sulfate (ZINC 15 PO) Take 15  mg by mouth daily.   Current Facility-Administered Medications for the 07/09/21 encounter (Office Visit) with Tanda Rockers, MD  Medication   0.9 %  sodium chloride infusion                 Objective:     07/09/2021        180  08/28/2020      178  12/24/2019          220  01/26/2019        213    09/08/18 200 lb (90.7 kg)  07/15/18 199 lb (90.3 kg)  07/01/18 199 lb 6.4 oz (90.4 kg)     Vital signs reviewed  07/09/2021  - Note at rest 02 sats  94% on 94   General appearance:    amb slt hoarse wm nad    HEENT : pt wearing mask not removed for exam due to covid - 19 concerns.   NECK :  without JVD/Nodes/TM/ nl carotid upstrokes bilaterally   LUNGS: no acc muscle use,  Min barrel  contour chest wall with bilateral  slightly decreased bs s audible wheeze and  without cough on insp or exp maneuvers and min  Hyperresonant  to  percussion bilaterally     CV:  RRR  no s3   2/6 SEM  s increase in P2, and no edema   ABD:  soft and nontender with pos end  insp Hoover's  in the supine position. No bruits or organomegaly appreciated, bowel sounds nl  MS:   Nl gait/  ext warm without deformities, calf tenderness, cyanosis or clubbing No obvious joint restrictions   SKIN: warm and dry without lesions    NEURO:  alert, approp, nl sensorium with  no motor or cerebellar deficits apparent.              Assessment

## 2021-07-09 NOTE — Patient Instructions (Signed)
No change in medications   Please schedule a follow up visit in 12  months but call sooner if needed  

## 2021-07-09 NOTE — Assessment & Plan Note (Signed)
Quit smoking 2014 but also has RA Spirometry 09/08/2018  FEV1 1.0 (30%)  Ratio 56 with classic curvature   On no rx    - PFT's  10/23/2018  FEV1 1.25  (40 % ) ratio 49  p 14 % improvement from saba p nothing prior to study with DLCO  81 % corrects to 124 % for alv volume   - Alpha One AT screening 10/23/2018   MM level 139  - 08/28/2020  After extensive coaching inhaler device,  effectiveness =    75%  Copd/ AB well controlled on symbicort just at 160 x 2 each am with the pm doses optional Based on two studies from NEJM  378; 20 p 1865 (2018) and 380 : p2020-30 (2019) in pts with mild asthma it is reasonable to use  symbicort 2bid "prn" flare in this setting but I emphasized this was only shown with symbicort and takes advantage of the rapid onset of action but is not the same as "rescue therapy" but can be stopped once the acute symptoms have resolved and the need for rescue has been minimized (< 2 x weekly)    Pulmonary f/u is yearly.

## 2021-08-28 ENCOUNTER — Ambulatory Visit: Payer: Medicare Other | Admitting: Internal Medicine

## 2021-12-24 ENCOUNTER — Other Ambulatory Visit: Payer: Self-pay | Admitting: Internal Medicine

## 2022-02-05 ENCOUNTER — Telehealth: Payer: Self-pay | Admitting: Acute Care

## 2022-02-05 NOTE — Telephone Encounter (Signed)
Returned call to patient to schedule annual LDCT. No answer.  Left voicemail with call back information. ?

## 2022-02-06 ENCOUNTER — Other Ambulatory Visit: Payer: Self-pay

## 2022-02-06 DIAGNOSIS — Z87891 Personal history of nicotine dependence: Secondary | ICD-10-CM

## 2022-02-10 IMAGING — CT CT CHEST LUNG CANCER SCREENING LOW DOSE W/O CM
1 series · 15 of 33 positions shown, 19 images · non-contrast
Comparison: 08/18/2018 screening chest CT.

CLINICAL DATA: 74-year-old asymptomatic male former smoker with 106
pack-year smoking history, quit smoking 6 years prior.

EXAM:
CT CHEST WITHOUT CONTRAST LOW-DOSE FOR LUNG CANCER SCREENING
TECHNIQUE: Multidetector CT imaging of the chest was performed following the
standard protocol without IV contrast.

[Series 2: ldct screening <30 bmi · axial · 0.74mm/px · z∈[-305,-30]mm · 15 of 65 slices shown, 19 images]
[im 5/65  mediastinal]
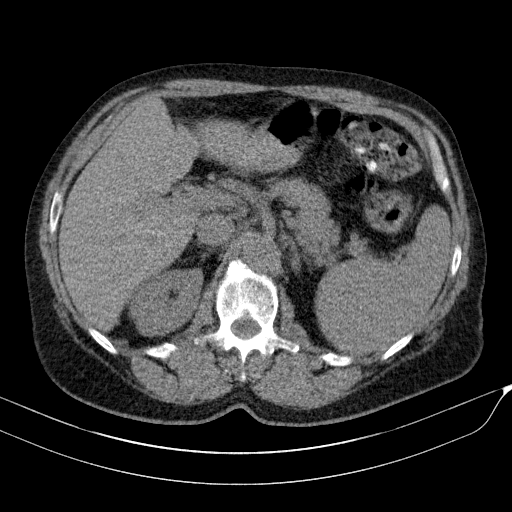
[im 5/65  lung]
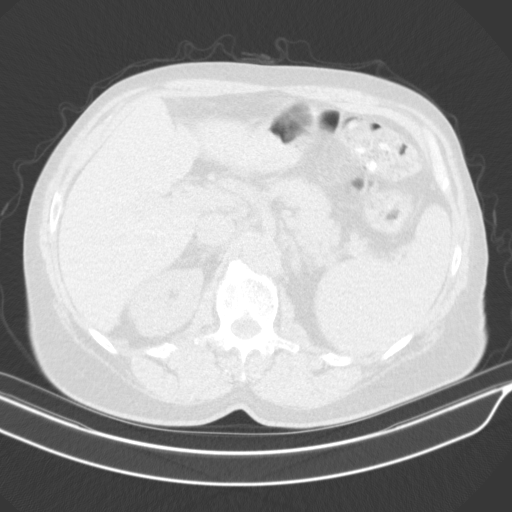
[im 10/65  lung]
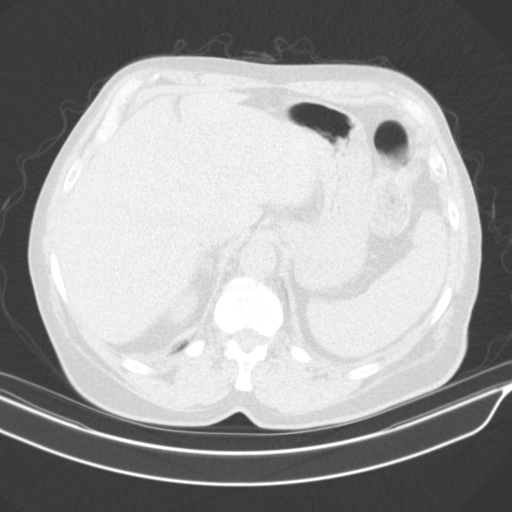
[im 13/65  lung]
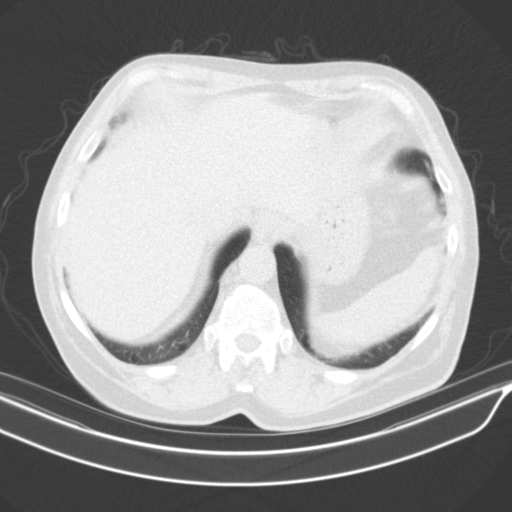
[im 17/65  lung]
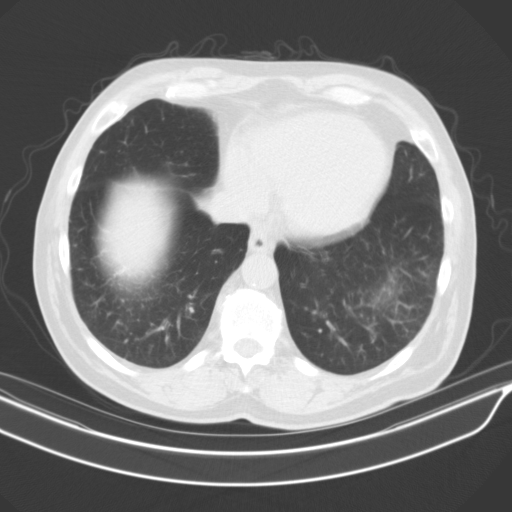
[im 22/65  mediastinal]
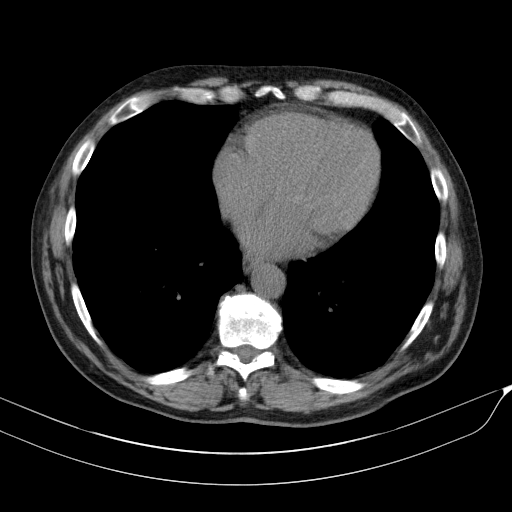
[im 22/65  lung]
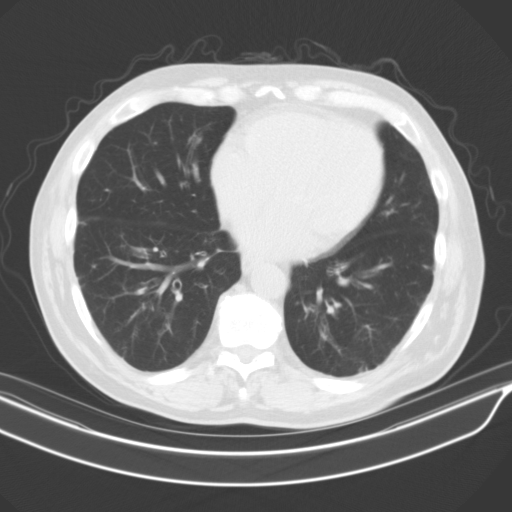
[im 26/65  lung]
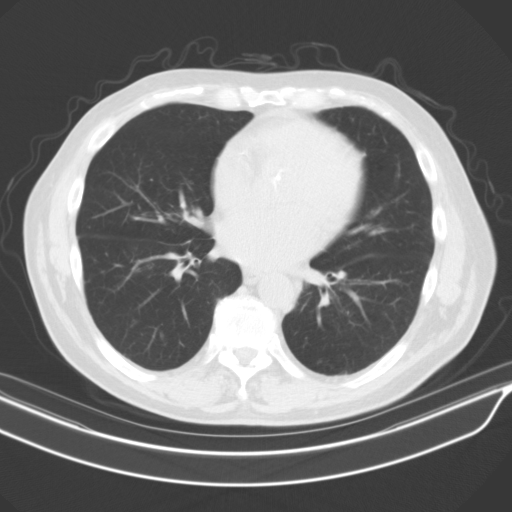
[im 29/65  lung]
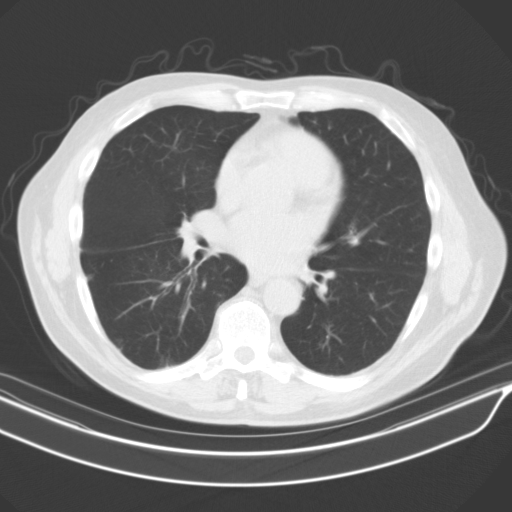
[im 34/65  lung]
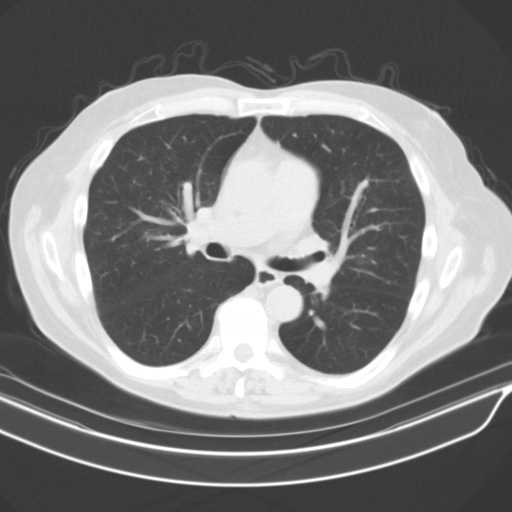
[im 36/65  mediastinal]
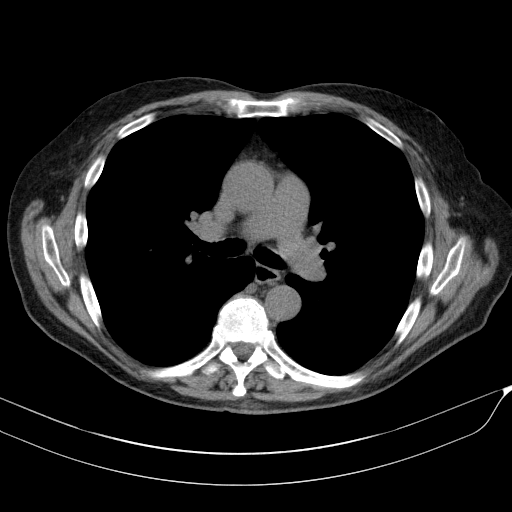
[im 36/65  lung]
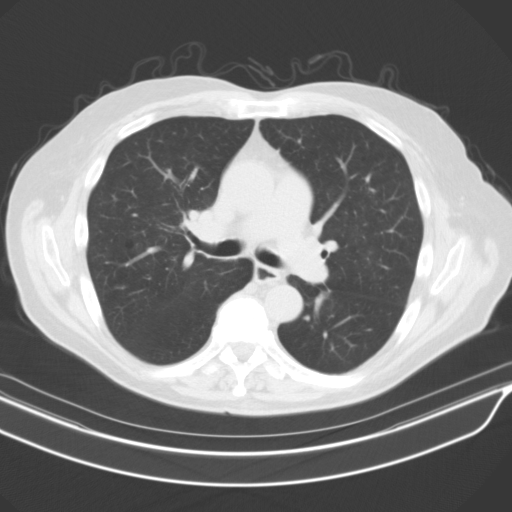
[im 39/65  lung]
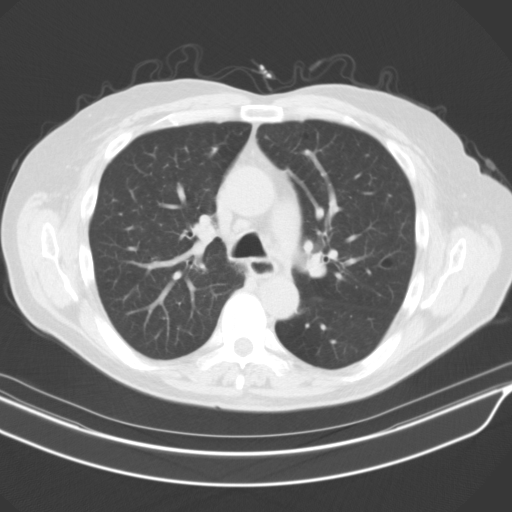
[im 43/65  lung]
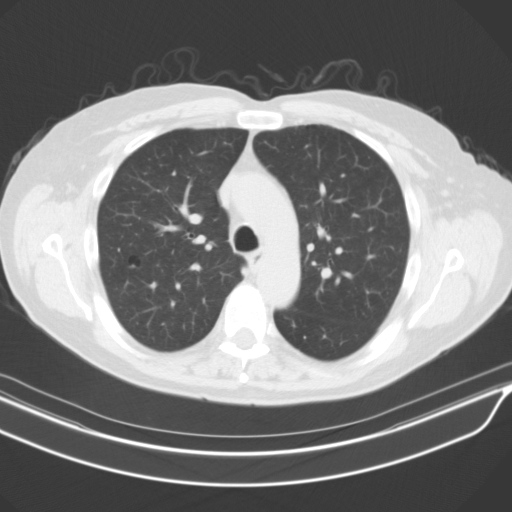
[im 48/65  lung]
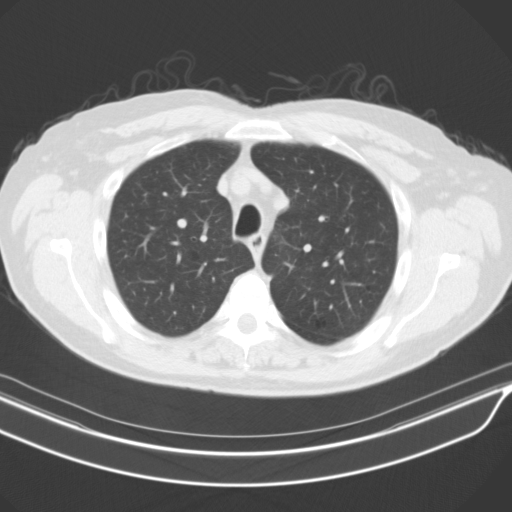
[im 52/65  mediastinal]
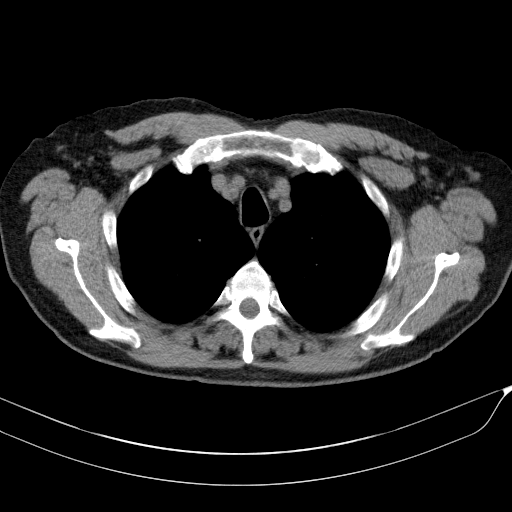
[im 52/65  lung]
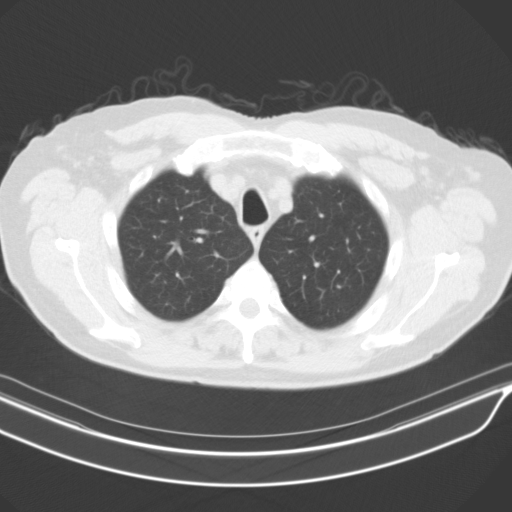
[im 55/65  lung]
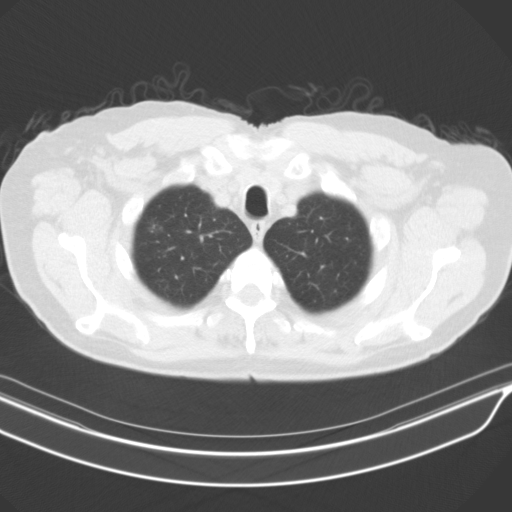
[im 60/65  lung]
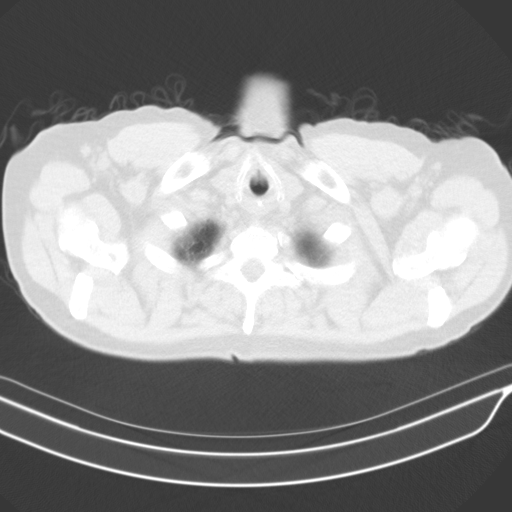

[15 of 33 positions shown; findings below may reference images not displayed]

FINDINGS: Cardiovascular: Normal heart size. No significant pericardial
effusion/thickening. Minimally atherosclerotic nonaneurysmal
thoracic aorta. Normal caliber pulmonary arteries.

Mediastinum/Nodes: No discrete thyroid nodules. Unremarkable
esophagus. No pathologically enlarged axillary, mediastinal or hilar
lymph nodes, noting limited sensitivity for the detection of hilar
adenopathy on this noncontrast study.

Lungs/Pleura: No pneumothorax. No pleural effusion. Mild
centrilobular emphysema with mild diffuse bronchial wall thickening.
No acute consolidative airspace disease or lung masses. Faint patchy
tree-in-bud type opacities at both lung bases appear unchanged,
favor chronic postinflammatory change. No significant growth of
previously visualized scattered tiny pulmonary nodules. No new
significant pulmonary nodules.

Upper abdomen: Nonobstructing 2 mm upper right renal stone.

Musculoskeletal: No aggressive appearing focal osseous lesions. Mild
thoracic spondylosis.
IMPRESSION: 1. Lung-RADS 2, benign appearance or behavior. Continue annual
screening with low-dose chest CT without contrast in 12 months.
2. Nonobstructing right nephrolithiasis.
3. Aortic Atherosclerosis (3CP4Q-ZU0.0) and Emphysema (3CP4Q-0JT.H).

## 2022-03-25 ENCOUNTER — Ambulatory Visit
Admission: RE | Admit: 2022-03-25 | Discharge: 2022-03-25 | Disposition: A | Discharge status: Home / Self Care | Payer: Medicare Other | Source: Ambulatory Visit | Attending: Acute Care | Admitting: Acute Care

## 2022-03-25 DIAGNOSIS — Z87891 Personal history of nicotine dependence: Secondary | ICD-10-CM

## 2022-03-27 ENCOUNTER — Other Ambulatory Visit: Payer: Self-pay

## 2022-03-27 DIAGNOSIS — Z122 Encounter for screening for malignant neoplasm of respiratory organs: Secondary | ICD-10-CM

## 2022-03-27 DIAGNOSIS — Z87891 Personal history of nicotine dependence: Secondary | ICD-10-CM

## 2022-03-27 NOTE — Progress Notes (Signed)
Ct

## 2022-04-18 ENCOUNTER — Encounter: Payer: Self-pay | Admitting: Gastroenterology

## 2022-07-09 ENCOUNTER — Ambulatory Visit: Payer: Medicare Other | Admitting: Internal Medicine

## 2022-07-09 ENCOUNTER — Encounter: Payer: Self-pay | Admitting: Gastroenterology

## 2022-08-16 ENCOUNTER — Ambulatory Visit (AMBULATORY_SURGERY_CENTER): Payer: Self-pay

## 2022-08-16 VITALS — Ht 69.5 in | Wt 175.0 lb

## 2022-08-16 DIAGNOSIS — Z86010 Personal history of colonic polyps: Secondary | ICD-10-CM

## 2022-08-16 MED ORDER — PEG 3350-KCL-NA BICARB-NACL 420 G PO SOLR: 4000.0000 mL | Freq: Once | ORAL | 0 refills | Status: AC

## 2022-08-16 NOTE — Progress Notes (Signed)
No egg or soy allergy known to patient  No issues known to pt with past sedation with any surgeries or procedures Patient denies ever being told they had issues or difficulty with intubation  No FH of Malignant Hyperthermia Pt is not on diet pills Pt is not on  home 02  Pt is not on blood thinners  Pt denies issues with constipation  No A fib or A flutter Have any cardiac testing pending--denied Pt instructed to use [email or web address] or GoodRx for a price reduction on prep   PV conducted over phone. Two day Gol[facility] prep instructions mailed to verified home address and sent via MyChart for pt review (currently in [place], returning home week before procedure).

## 2022-08-26 ENCOUNTER — Encounter: Payer: Self-pay | Admitting: Gastroenterology

## 2022-09-01 NOTE — Progress Notes (Unsigned)
Scott Ho, male    DOB: 19-May-1946     MRN: [id number]    Brief patient profile:  61    yowm MM/GOLD III copd  with h/o RA since his 52's controls with prn prednisone   quit smoking 2014 with  some cough that resolved and bothered more by nasal congestion since around 2017 which is  no better with otc's or nasal but never seen by ent / allergy and underwent  LDSCT 08/19/18 which suggested bronchiolitis/ copd changes so referred to pulmonary clinic 09/08/2018 by Dr   Drema Dallas and proved to have GOLD III criteria 10/23/2018    History of Present Illness  09/08/2018 Pulmonary/ 1st office eval/ Rodrickus Min  Chief Complaint  Patient presents with   Consult    Former smoker, abnormal CT, states he has occasional SOB denies chest discomfort or cough. States he just wanted to make sure he does not have cancer.   Dyspnea:  1.4 miles in 28 minutes around neighborhood includes some hills s limiting sob / more limited by arthritis typically than sob Cough: am congestion min white mucus  Sleep: no problem flat/ 2 pillows  Previously needed pred 5 mg "daily" for RA but since around 2014 not needing regularly  rec No change rx     07/09/2021  f/u ov/Danella Philson re: copd GOLD III    maint on symbicort 160 2 puffs each am  only, no RA symptoms Chief Complaint  Patient presents with   Follow-up    Breathing is overall doing well. He uses symbicort once daily while here in [place] but none when in [place].    Dyspnea: still walking up to an hour s stopping with some hills  Cough: none  Sleeping: no resp cc flat, one or two pillows SABA use: none  02: none  Covid status: x 4 vax / never infected  Rec No change rx   09/02/2022  f/u ov/Treonna Klee re: GOLD 3 COPD    maint on symbicort 160 2 puff prior to ex   Chief Complaint  Patient presents with   Follow-up    F/u chest CT. COPD  Dyspnea:  yardwork, walking up to an hour some hills  Cough: assoc with nasal drainage only  Sleeping: flat bed/ 2 pillows  SABA use: none   02: none      No obvious day to day or daytime variability or assoc excess/ purulent sputum or mucus plugs or hemoptysis or cp or chest tightness, subjective wheeze or overt sinus or hb symptoms.   *** without nocturnal  or early am exacerbation  of respiratory  c/o's or need for noct saba. Also denies any obvious fluctuation of symptoms with weather or environmental changes or other aggravating or alleviating factors except as outlined above   No unusual exposure hx or h/o childhood pna/ asthma or knowledge of premature birth.  Current Allergies, Complete Past Medical History, Past Surgical History, Family History, and Social History were reviewed in [employer] record.  ROS  The following are not active complaints unless bolded Hoarseness, sore throat, dysphagia, dental problems, itching, sneezing,  nasal congestion or discharge of excess mucus or purulent secretions, ear ache,   fever, chills, sweats, unintended wt loss or wt gain, classically pleuritic or exertional cp,  orthopnea pnd or arm/hand swelling  or leg swelling, presyncope, palpitations, abdominal pain, anorexia, nausea, vomiting, diarrhea  or change in bowel habits or change in bladder habits, change in stools or change in urine, dysuria,  hematuria,  rash, arthralgias, visual complaints, headache, numbness, weakness or ataxia or problems with walking or coordination,  change in mood or  memory.        Current Meds  Medication Sig   B Complex Vitamins (B COMPLEX 100 PO) Take 1 capsule by mouth daily.   Cyanocobalamin (B-12 PO) Take 1 capsule by mouth daily.   HYDROcodone-acetaminophen (NORCO/VICODIN) 5-325 MG tablet Take 1 tablet by mouth every 4 (four) hours as needed.   loratadine (CLARITIN) 10 MG tablet Take 10 mg by mouth daily.   LORazepam (ATIVAN) 1 MG tablet Take 1 mg by mouth every 4 (four) hours as needed for anxiety.   Menaquinone-7 (VITAMIN K2 PO) Take 1 capsule by mouth every other day.    Multiple Vitamin (MULTIVITAMIN) tablet Take 1 tablet by mouth daily.   Multiple Vitamins-Minerals (ICAPS) CAPS Take 1 capsule by mouth daily.   Omega-3 Fatty Acids (FISH OIL) 1000 MG CAPS Take 1,000 mg by mouth daily.   Pyridoxine HCl (B-6 PO) Take 1 capsule by mouth daily.   rosuvastatin (CRESTOR) 5 MG tablet Take 5 mg by mouth daily.   SYMBICORT 160-4.5 MCG/ACT inhaler INHALE 2 PUFFS INTO THE LUNGS TWICE DAILY   tadalafil (CIALIS) 10 MG tablet Take 10 mg by mouth daily as needed for erectile dysfunction.   VITAMIN A PO Take 1 capsule by mouth every other day.   vitamin C (ASCORBIC ACID) 500 MG tablet Take 500 mg by mouth daily.   Zinc 50 MG TABS Take by mouth.   Zinc Sulfate (ZINC 15 PO) Take 15 mg by mouth daily.   Current Facility-Administered Medications for the 09/02/22 encounter (Office Visit) with Tanda Rockers, MD  Medication   0.9 %  sodium chloride infusion                           Objective:    Wts  09/02/2022      180  07/09/2021        180  08/28/2020      178  12/24/2019          220  01/26/2019        213    09/08/18 200 lb (90.7 kg)  07/15/18 199 lb (90.3 kg)  07/01/18 199 lb 6.4 oz (90.4 kg)    Vital signs reviewed  09/02/2022  - Note at rest 02 sats  97% on RA   General appearance:    ***      Min barrel    2/6 SEM   ***       I personally reviewed images and agree with radiology impression as follows:   Chest LDSCT  03/25/22  1. Lung-RADS 2, benign appearance or behavior. Continue annual screening with low-dose chest CT without contrast in 12 months. 2. Bilateral renal stones. 3. Aortic atherosclerosis (ICD10-I70.0). Coronary artery calcification. 4. Enlarged pulmonic trunk, indicative of pulmonary arterial hypertension. 5.  Emphysema (ICD10-J43.9).       Assessment

## 2022-09-02 ENCOUNTER — Ambulatory Visit: Payer: Medicare Other | Admitting: Internal Medicine

## 2022-09-02 ENCOUNTER — Encounter: Payer: Self-pay | Admitting: Internal Medicine

## 2022-09-02 DIAGNOSIS — J449 Chronic obstructive pulmonary disease, unspecified: Secondary | ICD-10-CM

## 2022-09-02 DIAGNOSIS — R011 Cardiac murmur, unspecified: Secondary | ICD-10-CM

## 2022-09-02 NOTE — Patient Instructions (Signed)
My office will be contacting you by phone for referral for echocardiogram   - if you don't hear back from my office within one week please call us back or notify us thru MyChart and we'll address it right away.    No change in medications recommendations   Please schedule a follow up visit in 12  months but call sooner if needed

## 2022-09-03 ENCOUNTER — Encounter: Payer: Self-pay | Admitting: Internal Medicine

## 2022-09-03 DIAGNOSIS — R011 Cardiac murmur, unspecified: Secondary | ICD-10-CM | POA: Insufficient documentation

## 2022-09-03 NOTE — Assessment & Plan Note (Signed)
Quit smoking 2014 but also has RA Spirometry 09/08/2018  FEV1 1.0 (30%)  Ratio 56 with classic curvature   On no rx    - PFT's  10/23/2018  FEV1 1.25  (40 % ) ratio 49  p 14 % improvement from saba p nothing prior to study with DLCO  81 % corrects to 124 % for alv volume   - Alpha One AT screening 10/23/2018   MM level 139  - 08/28/2020  After extensive coaching inhaler device,  effectiveness =    75%   - The proper method of use, as well as anticipated side effects, of a metered-dose inhaler were discussed and demonstrated to the patient using teach back method.   >>> using symbicort just  When he knows he's going to be active s tendency to aecopd - this is similar now to prn symbicort for asthma and is fine with me as long as results in adequate ex tol and not tendency to aecopd.

## 2022-09-03 NOTE — Assessment & Plan Note (Addendum)
Echo requested 09/02/2022 >>>     Needs echo to r/o AS and also assess ? Of cor pulmonale suggested on CTchest 03/25/22         Each maintenance medication was reviewed in detail including emphasizing most importantly the difference between maintenance and prns and under what circumstances the prns are to be triggered using an action plan format where appropriate.  Total time for H and P, chart review, counseling, reviewing hfa device(s) and generating customized AVS unique to this office visit / same day charting = 30 min yearly eval

## 2022-09-04 ENCOUNTER — Encounter: Payer: Self-pay | Admitting: Gastroenterology

## 2022-09-04 ENCOUNTER — Ambulatory Visit (AMBULATORY_SURGERY_CENTER): Payer: Medicare Other | Admitting: Gastroenterology

## 2022-09-04 VITALS — BP 115/70 | HR 79 | Temp 98.0°F | Resp 15 | Ht 69.0 in | Wt 175.0 lb

## 2022-09-04 DIAGNOSIS — D120 Benign neoplasm of cecum: Secondary | ICD-10-CM

## 2022-09-04 DIAGNOSIS — Z86010 Personal history of colonic polyps: Secondary | ICD-10-CM | POA: Diagnosis not present

## 2022-09-04 DIAGNOSIS — D123 Benign neoplasm of transverse colon: Secondary | ICD-10-CM | POA: Diagnosis not present

## 2022-09-04 DIAGNOSIS — Z09 Encounter for follow-up examination after completed treatment for conditions other than malignant neoplasm: Secondary | ICD-10-CM

## 2022-09-04 MED ORDER — SODIUM CHLORIDE 0.9 % IV SOLN: 500.0000 mL | Freq: Once | INTRAVENOUS | Status: DC

## 2022-09-04 NOTE — Progress Notes (Signed)
VSS, transported to PACU °

## 2022-09-04 NOTE — Patient Instructions (Signed)
Information on polyps given to you today.  Await pathology results.  Resume previous diet and medications.    YOU HAD AN ENDOSCOPIC PROCEDURE TODAY AT THE [facility]:   Refer to the procedure report that was given to you for any specific questions about what was found during the examination.  If the procedure report does not answer your questions, please call your gastroenterologist to clarify.  If you requested that your care partner not be given the details of your procedure findings, then the procedure report has been included in a sealed envelope for you to review at your convenience later.  YOU SHOULD EXPECT: Some feelings of bloating in the abdomen. Passage of more gas than usual.  Walking can help get rid of the air that was put into your GI tract during the procedure and reduce the bloating. If you had a lower endoscopy (such as a colonoscopy or flexible sigmoidoscopy) you may notice spotting of blood in your stool or on the toilet paper. If you underwent a bowel prep for your procedure, you may not have a normal bowel movement for a few days.  Please Note:  You might notice some irritation and congestion in your nose or some drainage.  This is from the oxygen used during your procedure.  There is no need for concern and it should clear up in a day or so.  SYMPTOMS TO REPORT IMMEDIATELY:  Following lower endoscopy (colonoscopy or flexible sigmoidoscopy):  Excessive amounts of blood in the stool  Significant tenderness or worsening of abdominal pains  Swelling of the abdomen that is new, acute  Fever of 100F or higher  For urgent or emergent issues, a gastroenterologist can be reached at any hour by calling [phone]. Do not use MyChart messaging for urgent concerns.    DIET:  We do recommend a small meal at first, but then you may proceed to your regular diet.  Drink plenty of fluids but you should avoid alcoholic beverages for 24 hours.  ACTIVITY:  You should  plan to take it easy for the rest of today and you should NOT DRIVE or use heavy machinery until tomorrow (because of the sedation medicines used during the test).    FOLLOW UP: Our staff will call the number listed on your records the next business day following your procedure.  We will call around 7:15- 8:00 am to check on you and address any questions or concerns that you may have regarding the information given to you following your procedure. If we do not reach you, we will leave a message.     If any biopsies were taken you will be contacted by phone or by letter within the next 1-3 weeks.  Please call us at [phone] if you have not heard about the biopsies in 3 weeks.    SIGNATURES/CONFIDENTIALITY: You and/or your care partner have signed paperwork which will be entered into your electronic medical record.  These signatures attest to the fact that that the information above on your After Visit Summary has been reviewed and is understood.  Full responsibility of the confidentiality of this discharge information lies with you and/or your care-partner.  

## 2022-09-04 NOTE — Op Note (Signed)
[facility] Patient Name: Scott Ho Procedure Date: 09/04/2022 9:46 AM MRN: [id number] Endoscopist: Remo Lipps P. Havery Scott Ho , MD Age: 76 Referring MD:  Date of Birth: Apr 09, 1946 Gender: Male Account #: [id number] Procedure:                Colonoscopy Indications:              High risk colon cancer surveillance: Personal                            history of colonic polyps - polyps removed 2019                            (limited by poor prep), 2020 (double prep). Double                            prep used for this exam. Medicines:                Monitored Anesthesia Care Procedure:                Pre-Anesthesia Assessment:                           - Prior to the procedure, a History and Physical                            was performed, and patient medications and                            allergies were reviewed. The patient's tolerance of                            previous anesthesia was also reviewed. The risks                            and benefits of the procedure and the sedation                            options and risks were discussed with the patient.                            All questions were answered, and informed consent                            was obtained. Prior Anticoagulants: The patient has                            taken no previous anticoagulant or antiplatelet                            agents. ASA Grade Assessment: II - A patient with                            mild systemic disease. After reviewing the risks  and benefits, the patient was deemed in                            satisfactory condition to undergo the procedure.                           After obtaining informed consent, the colonoscope                            was passed under direct vision. Throughout the                            procedure, the patient's blood pressure, pulse, and                            oxygen saturations were monitored  continuously. The                            CF [id number] #[id number] was introduced through the anus                            and advanced to the the cecum, identified by                            appendiceal orifice and ileocecal valve. The                            colonoscopy was performed without difficulty. The                            patient tolerated the procedure well. The quality                            of the bowel preparation was adequate. The                            ileocecal valve, appendiceal orifice, and rectum                            were photographed. Scope In: 9:51:42 AM Scope Out: 10:15:28 AM Scope Withdrawal Time: 0 hours 20 minutes 34 seconds  Total Procedure Duration: 0 hours 23 minutes 46 seconds  Findings:                 The perianal and digital rectal examinations were                            normal.                           A large amount of liquid stool was found in the                            entire colon, making visualization difficult.  Lavage of the colon was performed using copious                            amounts of sterile water, resulting in clearance                            with adequate visualization.                           A diminutive polyp was found in the cecum. The                            polyp was sessile. The polyp was removed with a                            cold snare. Resection and retrieval were complete.                           A 3 mm polyp was found in the transverse colon. The                            polyp was sessile. The polyp was removed with a                            cold snare. Resection and retrieval were complete.                           Internal hemorrhoids were found during                            retroflexion. The hemorrhoids were small.                           The exam was otherwise without abnormality. Complications:            No immediate complications.  Estimated blood loss:                            Minimal. Estimated Blood Loss:     Estimated blood loss was minimal. Impression:               - Stool in the entire examined colon requiring                            extensive lavage.                           - One diminutive polyp in the cecum, removed with a                            cold snare. Resected and retrieved.                           - One 3 mm polyp in the transverse colon, removed  with a cold snare. Resected and retrieved.                           - Internal hemorrhoids.                           - The examination was otherwise normal. Recommendation:           - Patient has a contact number available for                            emergencies. The signs and symptoms of potential                            delayed complications were discussed with the                            patient. Return to normal activities tomorrow.                            Written discharge instructions were provided to the                            patient.                           - Resume previous diet.                           - Continue present medications.                           - Await pathology results.                           - Likely no further surveillance exams needed given                            the patient's age and no high risk lesions on this                            exam Scott Ho. Havery [name], MD 09/04/2022 10:21:49 AM This report has been signed electronically.

## 2022-09-04 NOTE — Progress Notes (Signed)
[facility] History and Physical   Primary Care Physician:  Kathyrn Lass, MD   Reason for Procedure:   History of colon polyps  Plan:    colonoscopy     HPI: Scott Ho is a 76 y.o. male  here for colonoscopy surveillance - polyps removed in 2019 (advanced polyp) and 2020 (five polyps). Double prep for this exam given difficulty with prep in the past. Patient denies any bowel symptoms at this time. No family history of colon cancer known. Otherwise feels well without any cardiopulmonary symptoms.   I have discussed risks / benefits of anesthesia and endoscopic procedure with Sherrie Sport and they wish to proceed with the exams as outlined today.    Past Medical History:  Diagnosis Date   Allergy    Arthritis    Cancer ([place]) 2007   prostate   Cataract    both eyes,removed   COPD (chronic obstructive pulmonary disease) (HCC)    light case use inhaler    Past Surgical History:  Procedure Laterality Date   cataracts Bilateral    COLONOSCOPY     POLYPECTOMY     PROSTATECTOMY     WRIST FRACTURE SURGERY Left     Prior to Admission medications   Medication Sig Start Date End Date Taking? Authorizing Provider  B Complex Vitamins (B COMPLEX 100 PO) Take 1 capsule by mouth daily.   Yes [provider]  Cyanocobalamin (B-12 PO) Take 1 capsule by mouth daily.   Yes [provider]  loratadine (CLARITIN) 10 MG tablet Take 10 mg by mouth daily.   Yes [provider]  LORazepam (ATIVAN) 1 MG tablet Take 1 mg by mouth every 4 (four) hours as needed for anxiety.   Yes [provider]  Menaquinone-7 (VITAMIN K2 PO) Take 1 capsule by mouth every other day.   Yes [provider]  Multiple Vitamin (MULTIVITAMIN) tablet Take 1 tablet by mouth daily.   Yes [provider]  Multiple Vitamins-Minerals (ICAPS) CAPS Take 1 capsule by mouth daily.   Yes [provider]  Omega-3 Fatty Acids (FISH OIL) 1000 MG CAPS Take  1,000 mg by mouth daily.   Yes [provider]  Pyridoxine HCl (B-6 PO) Take 1 capsule by mouth daily.   Yes [provider]  rosuvastatin (CRESTOR) 5 MG tablet Take 5 mg by mouth daily. 06/11/22  Yes [provider]  SYMBICORT 160-4.5 MCG/ACT inhaler INHALE 2 PUFFS INTO THE LUNGS TWICE DAILY 12/24/21  Yes Tanda Rockers, MD  VITAMIN A PO Take 1 capsule by mouth every other day.   Yes [provider]  vitamin C (ASCORBIC ACID) 500 MG tablet Take 500 mg by mouth daily.   Yes [provider]  Zinc 50 MG TABS Take by mouth.   Yes [provider]  Zinc Sulfate (ZINC 15 PO) Take 15 mg by mouth daily.   Yes [provider]  HYDROcodone-acetaminophen (NORCO/VICODIN) 5-325 MG tablet Take 1 tablet by mouth every 4 (four) hours as needed. 11/27/19   Isla Pence, MD  tadalafil (CIALIS) 10 MG tablet Take 10 mg by mouth daily as needed for erectile dysfunction.    [provider]    Current Outpatient Medications  Medication Sig Dispense Refill   B Complex Vitamins (B COMPLEX 100 PO) Take 1 capsule by mouth daily.     Cyanocobalamin (B-12 PO) Take 1 capsule by mouth daily.     loratadine (CLARITIN) 10 MG tablet Take 10  mg by mouth daily.     LORazepam (ATIVAN) 1 MG tablet Take 1 mg by mouth every 4 (four) hours as needed for anxiety.     Menaquinone-7 (VITAMIN K2 PO) Take 1 capsule by mouth every other day.     Multiple Vitamin (MULTIVITAMIN) tablet Take 1 tablet by mouth daily.     Multiple Vitamins-Minerals (ICAPS) CAPS Take 1 capsule by mouth daily.     Omega-3 Fatty Acids (FISH OIL) 1000 MG CAPS Take 1,000 mg by mouth daily.     Pyridoxine HCl (B-6 PO) Take 1 capsule by mouth daily.     rosuvastatin (CRESTOR) 5 MG tablet Take 5 mg by mouth daily.     SYMBICORT 160-4.5 MCG/ACT inhaler INHALE 2 PUFFS INTO THE LUNGS TWICE DAILY 30.6 each 2   VITAMIN A PO Take 1 capsule by mouth every other day.     vitamin C (ASCORBIC ACID) 500 MG  tablet Take 500 mg by mouth daily.     Zinc 50 MG TABS Take by mouth.     Zinc Sulfate (ZINC 15 PO) Take 15 mg by mouth daily.     HYDROcodone-acetaminophen (NORCO/VICODIN) 5-325 MG tablet Take 1 tablet by mouth every 4 (four) hours as needed. 10 tablet 0   tadalafil (CIALIS) 10 MG tablet Take 10 mg by mouth daily as needed for erectile dysfunction.     Current Facility-Administered Medications  Medication Dose Route Frequency Provider Last Rate Last Admin   0.9 %  sodium chloride infusion  500 mL Intravenous Once Smith Potenza, Carlota Raspberry, MD       0.9 %  sodium chloride infusion  500 mL Intravenous Once Yohan Samons, Carlota Raspberry, MD        Allergies as of 09/04/2022 - Review Complete 09/04/2022  Allergen Reaction Noted   Bee venom Anaphylaxis 07/01/2018    Family History  Problem Relation Age of Onset   Colon cancer Neg Hx    Colon polyps Neg Hx    Esophageal cancer Neg Hx    Stomach cancer Neg Hx    Rectal cancer Neg Hx     Social History   Socioeconomic History   Marital status: Divorced    Spouse name: Not on file   Number of children: Not on file   Years of education: Not on file   Highest education level: Not on file  Occupational History   Not on file  Tobacco Use   Smoking status: Former    Packs/day: 3.00    Years: 50.00    Total pack years: 150.00    Types: Cigarettes    Quit date: 11/2013    Years since quitting: 8.8   Smokeless tobacco: Never  Vaping Use   Vaping Use: Never used  Substance and Sexual Activity   Alcohol use: Yes    Comment: occasinally",once every couple of weeks"   Drug use: No   Sexual activity: Not on file  Other Topics Concern   Not on file  Social History Narrative   Not on file   Social Determinants of Health   Financial Resource Strain: Not on file  Food Insecurity: Not on file  Transportation Needs: Not on file  Physical Activity: Not on file  Stress: Not on file  Social Connections: Not on file  Intimate Partner Violence:  Not on file    Review of Systems: All other review of systems negative except as mentioned in the HPI.  Physical Exam: Vital signs BP 133/67   Pulse 77  Temp 98 F (36.7 C) (Temporal)   Ht '5\' 9"'$  (1.753 m)   Wt 175 lb (79.4 kg)   SpO2 96%   BMI 25.84 kg/m   General:   Alert,  Well-developed, pleasant and cooperative in NAD Lungs:  Clear throughout to auscultation.   Heart:  Regular rate and rhythm Abdomen:  Soft, nontender and nondistended.   Neuro/Psych:  Alert and cooperative. Normal mood and affect. A and O x 3  Jolly Mango, MD [facility]

## 2022-09-04 NOTE — Progress Notes (Signed)
Pt's states no medical or surgical changes since previsit or office visit. 

## 2022-09-04 NOTE — Progress Notes (Signed)
Called to room to assist during endoscopic procedure.  Patient ID and intended procedure confirmed with present staff. Received instructions for my participation in the procedure from the performing physician.  

## 2022-09-05 ENCOUNTER — Telehealth: Payer: Self-pay

## 2022-09-05 NOTE — Telephone Encounter (Signed)
Left message on follow up call. 

## 2022-09-17 ENCOUNTER — Ambulatory Visit (INDEPENDENT_AMBULATORY_CARE_PROVIDER_SITE_OTHER): Payer: Medicare Other

## 2022-09-17 DIAGNOSIS — R011 Cardiac murmur, unspecified: Secondary | ICD-10-CM

## 2022-09-17 DIAGNOSIS — J449 Chronic obstructive pulmonary disease, unspecified: Secondary | ICD-10-CM | POA: Diagnosis not present

## 2022-09-17 LAB — ECHOCARDIOGRAM COMPLETE
AV Vena cont: 0.48 cm
Area-P 1/2: 3.91 cm2
MV M vel: 5.11 m/s
MV Peak grad: 104.4 mmHg
MV VTI: 1.72 cm2
P 1/2 time: 770 msec
Radius: 1 cm
S' Lateral: 3.34 cm

## 2022-09-20 ENCOUNTER — Telehealth: Payer: Self-pay | Admitting: Internal Medicine

## 2022-09-20 NOTE — Telephone Encounter (Signed)
The findings are very mild and nothing needed for now so I rec he tell his dentist about heart murmur and f/u with cards w/in the next year per PCP

## 2022-09-20 NOTE — Telephone Encounter (Signed)
Called and spoke with Dr Sanjuan Dame nurse and they are wanting to know if Dr Melvyn Novas is going to send in a referral to a cardiologist due to the results of recent echocardiogram  Please advise sir

## 2022-09-23 NOTE — Telephone Encounter (Signed)
Called and spoke to Dr Sanjuan Dame office and gave them Dr Morrison Old recommendations. His nurse stated that she would relay the information. I told her to call back if they needed anything else from our office. Nothing further needed

## 2022-10-02 ENCOUNTER — Ambulatory Visit: Payer: Medicare Other | Admitting: Internal Medicine

## 2022-10-02 ENCOUNTER — Encounter: Payer: Self-pay | Admitting: Internal Medicine

## 2022-10-02 VITALS — BP 149/86 | HR 71 | Ht 69.0 in | Wt 181.2 lb

## 2022-10-02 DIAGNOSIS — I340 Nonrheumatic mitral (valve) insufficiency: Secondary | ICD-10-CM

## 2022-10-02 DIAGNOSIS — I10 Essential (primary) hypertension: Secondary | ICD-10-CM

## 2022-10-02 DIAGNOSIS — E782 Mixed hyperlipidemia: Secondary | ICD-10-CM

## 2022-10-02 MED ORDER — METOPROLOL SUCCINATE ER 25 MG PO TB24: 25.0000 mg | ORAL_TABLET | Freq: Every day | ORAL | 3 refills | Status: AC

## 2022-10-02 NOTE — Progress Notes (Signed)
Primary Physician/Referring:  Kathyrn Lass, MD  [name], male    DOB: [birth year], 76 y.o.   MRN: [id number]  Chief Complaint  Patient presents with   New Patient (Initial Visit)        mitral valve insufficiency   HPI:    Scott Ho  is a 76 y.o. male with past medical history significant for COPD, hyperlipidemia, and newly diagnosed mitral regurgitation.  Patient has been following with pulmonology for many years, his doctor decided to obtain an echocardiogram due to patient's increasing shortness of breath.  He was found to have mitral regurgitation on his echo.  Currently the patient is not having any symptoms other than shortness of breath.  He is not retaining any fluid and he does not have any palpitations or cardiac arrhythmia.  He denies chest pain, palpitations, diaphoresis, syncope, edema, orthopnea, PND, claudication.  He has never had any issues with his heart in the past.  Patient is agreeable to undergoing TEE to further evaluate his mitral valve.  Past Medical History:  Diagnosis Date   Allergy    Arthritis    Cancer ([place]) 2007   prostate   Cataract    both eyes,removed   COPD (chronic obstructive pulmonary disease) (HCC)    light case use inhaler   Past Surgical History:  Procedure Laterality Date   cataracts Bilateral    COLONOSCOPY     POLYPECTOMY     PROSTATECTOMY     WRIST FRACTURE SURGERY Left    Family History  Problem Relation Age of Onset   Colon cancer Neg Hx    Colon polyps Neg Hx    Esophageal cancer Neg Hx    Stomach cancer Neg Hx    Rectal cancer Neg Hx     Social History   Tobacco Use   Smoking status: Former    Packs/day: 3.00    Years: 50.00    Total pack years: 150.00    Types: Cigarettes    Quit date: 11/2013    Years since quitting: 8.8   Smokeless tobacco: Never  Substance Use Topics   Alcohol use: Yes    Comment: occasinally",once every couple of weeks"   Marital Status: Divorced  ROS  Review of  Systems  Respiratory:  Positive for shortness of breath.    Objective  Blood pressure (!) 149/86, pulse 71, height '5\' 9"'$  (1.753 m), weight 181 lb 3.2 oz (82.2 kg), SpO2 96 %. Body mass index is 26.76 kg/m.     10/02/2022    2:21 PM 09/04/2022   10:39 AM 09/04/2022   10:29 AM  Vitals with BMI  Height '5\' 9"'$     Weight 181 lbs 3 oz    BMI 06.30    Systolic 160 109 323  Diastolic 86 70 58  Pulse 71 79 84     Physical Exam Vitals reviewed.  HENT:     Head: Normocephalic and atraumatic.  Cardiovascular:     Rate and Rhythm: Normal rate and regular rhythm.     Pulses: Normal pulses.     Heart sounds: Murmur heard.  Pulmonary:     Effort: Pulmonary effort is normal.     Breath sounds: Normal breath sounds.  Abdominal:     General: Bowel sounds are normal.  Musculoskeletal:     Right lower leg: No edema.     Left lower leg: No edema.  Skin:    General: Skin is warm and dry.  Neurological:  Mental Status: He is alert.     Medications and allergies   Allergies  Allergen Reactions   Bee Venom Anaphylaxis     Medication list after today's encounter   Current Outpatient Medications:    B Complex Vitamins (B COMPLEX 100 PO), Take 1 capsule by mouth daily., Disp: , Rfl:    Cyanocobalamin (B-12 PO), Take 1 capsule by mouth daily., Disp: , Rfl:    loratadine (CLARITIN) 10 MG tablet, Take 10 mg by mouth daily., Disp: , Rfl:    LORazepam (ATIVAN) 1 MG tablet, Take 1 mg by mouth every 4 (four) hours as needed for anxiety., Disp: , Rfl:    Menaquinone-7 (VITAMIN K2 PO), Take 1 capsule by mouth every other day., Disp: , Rfl:    metoprolol succinate (TOPROL XL) 25 MG 24 hr tablet, Take 1 tablet (25 mg total) by mouth daily., Disp: 90 tablet, Rfl: 3   Multiple Vitamin (MULTIVITAMIN) tablet, Take 1 tablet by mouth daily., Disp: , Rfl:    Multiple Vitamins-Minerals (ICAPS) CAPS, Take 1 capsule by mouth daily., Disp: , Rfl:    Omega-3 Fatty Acids (FISH OIL) 1000 MG CAPS, Take 1,000  mg by mouth daily., Disp: , Rfl:    Pyridoxine HCl (B-6 PO), Take 1 capsule by mouth daily., Disp: , Rfl:    rosuvastatin (CRESTOR) 5 MG tablet, Take 5 mg by mouth daily., Disp: , Rfl:    SYMBICORT 160-4.5 MCG/ACT inhaler, INHALE 2 PUFFS INTO THE LUNGS TWICE DAILY, Disp: 30.6 each, Rfl: 2   tadalafil (CIALIS) 10 MG tablet, Take 10 mg by mouth daily as needed for erectile dysfunction., Disp: , Rfl:    VITAMIN A PO, Take 1 capsule by mouth every other day., Disp: , Rfl:    vitamin C (ASCORBIC ACID) 500 MG tablet, Take 500 mg by mouth daily., Disp: , Rfl:    Zinc 50 MG TABS, Take by mouth., Disp: , Rfl:    Zinc Sulfate (ZINC 15 PO), Take 15 mg by mouth daily., Disp: , Rfl:    HYDROcodone-acetaminophen (NORCO/VICODIN) 5-325 MG tablet, Take 1 tablet by mouth every 4 (four) hours as needed., Disp: 10 tablet, Rfl: 0  Current Facility-Administered Medications:    0.9 %  sodium chloride infusion, 500 mL, Intravenous, Once, Armbruster, Carlota Raspberry, MD  Laboratory examination:   Lab Results  Component Value Date   NA 142 05/01/2009   K 4.0 05/01/2009   CO2 26 05/01/2009   GLUCOSE 95 05/01/2009   BUN 22 05/01/2009   CREATININE 0.83 05/01/2009   CALCIUM 9.0 05/01/2009   GFRNONAA >60 05/01/2009       Latest Ref Rng & Units 05/01/2009   11:45 AM  CMP  Glucose 70 - 99 mg/dL 95   BUN 6 - 23 mg/dL 22   Creatinine 0.4 - 1.5 mg/dL 0.83   Sodium 135 - 145 mEq/L 142   Potassium 3.5 - 5.1 mEq/L 4.0   Chloride 96 - 112 mEq/L 109   CO2 19 - 32 mEq/L 26   Calcium 8.4 - 10.5 mg/dL 9.0       Latest Ref Rng & Units 10/23/2018    2:44 PM 05/01/2009   11:45 AM  CBC  WBC 4.0 - 10.5 K/uL 7.4  8.7   Hemoglobin 13.0 - 17.0 g/dL 14.7  14.7   Hematocrit 39.0 - 52.0 % 45.3  44.5   Platelets 150.0 - 400.0 K/uL 323.0  363     Lipid Panel No results for input(s): "CHOL", "TRIG", "  [place]", "VLDL", "HDL", "CHOLHDL", "LDLDIRECT" in the last 8760 hours.  HEMOGLOBIN A1C No results found for: "HGBA1C",  "MPG" TSH No results for input(s): "TSH" in the last 8760 hours.  External labs:     Radiology:    Cardiac Studies:   ECHO IMPRESSIONS 09/17/2022   1. Left ventricular ejection fraction, by estimation, is 60 to 65%. The left ventricle has normal function. The left ventricle has no regional wall motion abnormalities. There is mild asymmetric left ventricular hypertrophy of the infero-lateral segment. Left ventricular diastolic parameters are consistent with Grade II diastolic dysfunction (pseudonormalization). Elevated left ventricular end-diastolic pressure. The average left ventricular global longitudinal strain is -20.1 %. The global longitudinal strain is normal.   2. Right ventricular systolic function is normal. The right ventricular size is mildly enlarged. There is normal pulmonary artery systolic pressure. The estimated right ventricular systolic pressure is 35.3 mmHg.   3. Left atrial size was mildly dilated.   4. The mitral valve is degenerative. Moderate mitral valve regurgitation. Mild mitral stenosis. The mean mitral valve gradient is 5.0 mmHg. PISA MR EROA radius 1cm, MR ERO 0.38cm2, MR RV 70cc   5. The aortic valve is tricuspid. There is moderate calcification of the aortic valve. Aortic valve regurgitation is mild. Aortic valve sclerosis/calcification is present, without any evidence of aortic stenosis. Aortic regurgitation PHT measures 770 msec.   6. Aortic dilatation noted. There is mild dilatation of the ascending aorta, measuring 39 mm.   7. The inferior vena cava is normal in size with greater than 50% respiratory variability, suggesting right atrial pressure of 3 mmHg.     EKG:   10/02/2022 normal sinus rhythm with left atrial enlargement, no evidence of acute ischemia  Assessment     ICD-10-CM   1. Mitral valve insufficiency, unspecified etiology  I34.0 EKG 12-Lead    2. Essential hypertension  I10     3. Mixed hyperlipidemia  E78.2        Orders Placed  This Encounter  Procedures   EKG 12-Lead    Meds ordered this encounter  Medications   metoprolol succinate (TOPROL XL) 25 MG 24 hr tablet    Sig: Take 1 tablet (25 mg total) by mouth daily.    Dispense:  90 tablet    Refill:  3    There are no discontinued medications.   Recommendations:   Scott Ho is a 76 y.o.  male with new severe MR  Mitral valve insufficiency, unspecified etiology I personally reviewed the echo, mitral regurgitation is severe by all parameters Will schedule for TEE at hospital with me Discussed patient should call immediately and return to the office if experiencing palpitations, heart racing, orthopnea, or edema   Essential hypertension Will add low-dose metoprolol Encourage low-sodium diet, less than 2000 mg daily.   Mixed hyperlipidemia Continue statin   Follow-up in 6 weeks or sooner if needed    Floydene Flock, DO, [facility]  10/02/2022, 2:43 PM Office: [phone] Pager: [phone]

## 2022-10-17 ENCOUNTER — Other Ambulatory Visit: Payer: Self-pay | Admitting: Internal Medicine

## 2022-10-18 ENCOUNTER — Encounter (HOSPITAL_COMMUNITY): Payer: Self-pay | Admitting: Cardiology

## 2022-10-18 ENCOUNTER — Other Ambulatory Visit: Payer: Self-pay

## 2022-10-18 ENCOUNTER — Ambulatory Visit (HOSPITAL_COMMUNITY)
Admission: RE | Admit: 2022-10-18 | Discharge: 2022-10-18 | Disposition: A | Discharge status: Home / Self Care | Payer: Medicare Other | Source: Ambulatory Visit | Attending: Cardiology | Admitting: Cardiology

## 2022-10-18 ENCOUNTER — Ambulatory Visit (HOSPITAL_COMMUNITY): Payer: Medicare Other

## 2022-10-18 ENCOUNTER — Encounter (HOSPITAL_COMMUNITY)
Admission: RE | Disposition: A | Discharge status: Home / Self Care | Payer: Self-pay | Source: Ambulatory Visit | Attending: Cardiology

## 2022-10-18 ENCOUNTER — Ambulatory Visit (HOSPITAL_COMMUNITY): Payer: Medicare Other | Admitting: Anesthesiology

## 2022-10-18 ENCOUNTER — Ambulatory Visit (HOSPITAL_BASED_OUTPATIENT_CLINIC_OR_DEPARTMENT_OTHER): Payer: Medicare Other | Admitting: Anesthesiology

## 2022-10-18 DIAGNOSIS — I340 Nonrheumatic mitral (valve) insufficiency: Secondary | ICD-10-CM

## 2022-10-18 DIAGNOSIS — J449 Chronic obstructive pulmonary disease, unspecified: Secondary | ICD-10-CM

## 2022-10-18 DIAGNOSIS — E782 Mixed hyperlipidemia: Secondary | ICD-10-CM | POA: Insufficient documentation

## 2022-10-18 DIAGNOSIS — I10 Essential (primary) hypertension: Secondary | ICD-10-CM

## 2022-10-18 DIAGNOSIS — Z87891 Personal history of nicotine dependence: Secondary | ICD-10-CM

## 2022-10-18 HISTORY — PX: TEE WITHOUT CARDIOVERSION: SHX5443

## 2022-10-18 SURGERY — ECHOCARDIOGRAM, TRANSESOPHAGEAL
Anesthesia: Monitor Anesthesia Care

## 2022-10-18 MED ORDER — PROPOFOL 500 MG/50ML IV EMUL
INTRAVENOUS | Status: DC | PRN
  Administered 2022-10-18: 175 ug/kg/min via INTRAVENOUS

## 2022-10-18 MED ORDER — SODIUM CHLORIDE 0.9 % IV SOLN: INTRAVENOUS | Status: DC

## 2022-10-18 MED ORDER — PROPOFOL 10 MG/ML IV BOLUS
INTRAVENOUS | Status: DC | PRN
  Administered 2022-10-18: 50 mg via INTRAVENOUS

## 2022-10-18 MED ORDER — PHENYLEPHRINE HCL-NACL 20-0.9 MG/250ML-% IV SOLN
INTRAVENOUS | Status: DC | PRN
  Administered 2022-10-18: 20 ug/min via INTRAVENOUS

## 2022-10-18 MED ORDER — PHENYLEPHRINE 80 MCG/ML (10ML) SYRINGE FOR IV PUSH (FOR BLOOD PRESSURE SUPPORT)
PREFILLED_SYRINGE | INTRAVENOUS | Status: DC | PRN
  Administered 2022-10-18: 80 ug via INTRAVENOUS
  Administered 2022-10-18: 160 ug via INTRAVENOUS
  Administered 2022-10-18: 40 ug via INTRAVENOUS

## 2022-10-18 MED ORDER — LIDOCAINE 2% (20 MG/ML) 5 ML SYRINGE
INTRAMUSCULAR | Status: DC | PRN
  Administered 2022-10-18: 100 mg via INTRAVENOUS

## 2022-10-18 NOTE — Discharge Instructions (Signed)

## 2022-10-18 NOTE — Progress Notes (Signed)
Echocardiogram 2D Echocardiogram has been performed.  Fidel Levy 10/18/2022, 1:17 PM

## 2022-10-18 NOTE — Anesthesia Postprocedure Evaluation (Signed)
Anesthesia Post Note  Patient: TEVON BERHANE  Procedure(s) Performed: TRANSESOPHAGEAL ECHOCARDIOGRAM (TEE)     Patient location during evaluation: PACU Anesthesia Type: MAC Level of consciousness: awake and alert and oriented Pain management: pain level controlled Vital Signs Assessment: post-procedure vital signs reviewed and stable Respiratory status: spontaneous breathing, nonlabored ventilation and respiratory function stable Cardiovascular status: stable and blood pressure returned to baseline Postop Assessment: no apparent nausea or vomiting Anesthetic complications: no   No notable events documented.  Last Vitals:  Vitals:   10/18/22 1310 10/18/22 1320  BP: 96/62 110/62  Pulse: 61 (!) 58  Resp: 19 15  Temp:    SpO2: 97% 98%    Last Pain:  Vitals:   10/18/22 1258  TempSrc: Temporal  PainSc: 0-No pain                 Adely Facer A.

## 2022-10-18 NOTE — Anesthesia Preprocedure Evaluation (Signed)
Anesthesia Evaluation  Patient identified by MRN, date of birth, ID band Patient awake    Reviewed: Allergy & Precautions, NPO status , Patient's Chart, lab work & pertinent test results, reviewed documented beta blocker date and time   Airway Mallampati: II  TM Distance: >3 FB Neck ROM: Full    Dental no notable dental hx.    Pulmonary COPD,  COPD inhaler, former smoker   breath sounds clear to auscultation + decreased breath sounds      Cardiovascular hypertension, Pt. on medications and Pt. on home beta blockers + Valvular Problems/Murmurs MR  Rhythm:Regular Rate:Normal + Systolic murmurs    Neuro/Psych negative neurological ROS  negative psych ROS   GI/Hepatic negative GI ROS, Neg liver ROS,,,  Endo/Other  Hyperlipidemia  Renal/GU negative Renal ROS   Hx/o Prostate Ca S/P prostatectomy    Musculoskeletal  (+) Arthritis , Osteoarthritis,    Abdominal   Peds  Hematology negative hematology ROS (+)   Anesthesia Other Findings   Reproductive/Obstetrics                              Anesthesia Physical Anesthesia Plan  ASA: 3  Anesthesia Plan: MAC   Post-op Pain Management: Minimal or no pain anticipated   Induction: Intravenous  PONV Risk Score and Plan: 1 and Propofol infusion and Treatment may vary due to age or medical condition  Airway Management Planned: Natural Airway and Nasal Cannula  Additional Equipment: None  Intra-op Plan:   Post-operative Plan:   Informed Consent: I have reviewed the patients History and Physical, chart, labs and discussed the procedure including the risks, benefits and alternatives for the proposed anesthesia with the patient or authorized representative who has indicated his/her understanding and acceptance.     Dental advisory given  Plan Discussed with: CRNA and Anesthesiologist  Anesthesia Plan Comments:          Anesthesia  Quick Evaluation

## 2022-10-18 NOTE — Transfer of Care (Signed)
Immediate Anesthesia Transfer of Care Note  Patient: Scott Ho  Procedure(s) Performed: TRANSESOPHAGEAL ECHOCARDIOGRAM (TEE)  Patient Location: Short Stay  Anesthesia Type:MAC  Level of Consciousness: drowsy  Airway & Oxygen Therapy: Patient Spontanous Breathing  Post-op Assessment: Report given to RN and Post -op Vital signs reviewed and stable  Post vital signs: Reviewed and stable  Last Vitals:  Vitals Value Taken Time  BP 91/46 10/18/22 1258  Temp    Pulse 55 10/18/22 1259  Resp 14 10/18/22 1259  SpO2 97 % 10/18/22 1259  Vitals shown include unvalidated device data.  Last Pain:  Vitals:   10/18/22 1118  TempSrc: Temporal  PainSc: 0-No pain         Complications: No notable events documented.

## 2022-10-18 NOTE — Anesthesia Procedure Notes (Signed)
Procedure Name: General with mask airway Date/Time: 10/18/2022 12:30 PM  Performed by: Erick Colace, CRNAPre-anesthesia Checklist: Patient identified, Emergency Drugs available, Suction available and Patient being monitored Patient Re-evaluated:Patient Re-evaluated prior to induction Oxygen Delivery Method: Nasal cannula Preoxygenation: Pre-oxygenation with 100% oxygen Induction Type: IV induction Airway Equipment and Method: Bite block Dental Injury: Teeth and Oropharynx as per pre-operative assessment

## 2022-10-21 ENCOUNTER — Encounter (HOSPITAL_COMMUNITY): Payer: Self-pay | Admitting: Cardiology

## 2022-10-24 ENCOUNTER — Encounter (HOSPITAL_COMMUNITY): Payer: Self-pay | Admitting: Internal Medicine

## 2022-10-25 NOTE — H&P (Signed)
Primary Physician/Referring:  Kathyrn Lass, MD  [name], male    DOB: [birth year], 76 y.o.   MRN: [id number]  Chief Complaint  Patient presents with   New Patient (Initial Visit)        mitral valve insufficiency   HPI:    [name]  is a 76 y.o. male with past medical history significant for COPD, hyperlipidemia, and newly diagnosed mitral regurgitation.  Patient has been following with pulmonology for many years, his doctor decided to obtain an echocardiogram due to patient's increasing shortness of breath.  He was found to have mitral regurgitation on his echo.  Currently the patient is not having any symptoms other than shortness of breath.  He is not retaining any fluid and he does not have any palpitations or cardiac arrhythmia.  He denies chest pain, palpitations, diaphoresis, syncope, edema, orthopnea, PND, claudication.  He has never had any issues with his heart in the past.  Patient is agreeable to undergoing TEE to further evaluate his mitral valve.  Past Medical History:  Diagnosis Date   Allergy    Arthritis    Cancer ([place]) 2007   prostate   Cataract    both eyes,removed   COPD (chronic obstructive pulmonary disease) (HCC)    light case use inhaler   Past Surgical History:  Procedure Laterality Date   cataracts Bilateral    COLONOSCOPY     POLYPECTOMY     PROSTATECTOMY     WRIST FRACTURE SURGERY Left    Family History  Problem Relation Age of Onset   Colon cancer Neg Hx    Colon polyps Neg Hx    Esophageal cancer Neg Hx    Stomach cancer Neg Hx    Rectal cancer Neg Hx     Social History   Tobacco Use   Smoking status: Former    Packs/day: 3.00    Years: 50.00    Total pack years: 150.00    Types: Cigarettes    Quit date: 11/2013    Years since quitting: 8.8   Smokeless tobacco: Never  Substance Use Topics   Alcohol use: Yes    Comment: occasinally",once every couple of weeks"   Marital Status: Divorced  ROS  Review of  Systems  Respiratory:  Positive for shortness of breath.    Objective  Blood pressure (!) 149/86, pulse 71, height '5\' 9"'$  (1.753 m), weight 181 lb 3.2 oz (82.2 kg), SpO2 96 %. Body mass index is 26.76 kg/m.     10/02/2022    2:21 PM 09/04/2022   10:39 AM 09/04/2022   10:29 AM  Vitals with BMI  Height '5\' 9"'$     Weight 181 lbs 3 oz    BMI 74.12    Systolic 878 676 720  Diastolic 86 70 58  Pulse 71 79 84     Physical Exam Vitals reviewed.  HENT:     Head: Normocephalic and atraumatic.  Cardiovascular:     Rate and Rhythm: Normal rate and regular rhythm.     Pulses: Normal pulses.     Heart sounds: Murmur heard.  Pulmonary:     Effort: Pulmonary effort is normal.     Breath sounds: Normal breath sounds.  Abdominal:     General: Bowel sounds are normal.  Musculoskeletal:     Right lower leg: No edema.     Left lower leg: No edema.  Skin:    General: Skin is warm and dry.  Neurological:  Mental Status: He is alert.     Medications and allergies   Allergies  Allergen Reactions   Bee Venom Anaphylaxis     Medication list after today's encounter   Current Outpatient Medications:    B Complex Vitamins (B COMPLEX 100 PO), Take 1 capsule by mouth daily., Disp: , Rfl:    Cyanocobalamin (B-12 PO), Take 1 capsule by mouth daily., Disp: , Rfl:    loratadine (CLARITIN) 10 MG tablet, Take 10 mg by mouth daily., Disp: , Rfl:    LORazepam (ATIVAN) 1 MG tablet, Take 1 mg by mouth every 4 (four) hours as needed for anxiety., Disp: , Rfl:    Menaquinone-7 (VITAMIN K2 PO), Take 1 capsule by mouth every other day., Disp: , Rfl:    metoprolol succinate (TOPROL XL) 25 MG 24 hr tablet, Take 1 tablet (25 mg total) by mouth daily., Disp: 90 tablet, Rfl: 3   Multiple Vitamin (MULTIVITAMIN) tablet, Take 1 tablet by mouth daily., Disp: , Rfl:    Multiple Vitamins-Minerals (ICAPS) CAPS, Take 1 capsule by mouth daily., Disp: , Rfl:    Omega-3 Fatty Acids (FISH OIL) 1000 MG CAPS, Take 1,000  mg by mouth daily., Disp: , Rfl:    Pyridoxine HCl (B-6 PO), Take 1 capsule by mouth daily., Disp: , Rfl:    rosuvastatin (CRESTOR) 5 MG tablet, Take 5 mg by mouth daily., Disp: , Rfl:    SYMBICORT 160-4.5 MCG/ACT inhaler, INHALE 2 PUFFS INTO THE LUNGS TWICE DAILY, Disp: 30.6 each, Rfl: 2   tadalafil (CIALIS) 10 MG tablet, Take 10 mg by mouth daily as needed for erectile dysfunction., Disp: , Rfl:    VITAMIN A PO, Take 1 capsule by mouth every other day., Disp: , Rfl:    vitamin C (ASCORBIC ACID) 500 MG tablet, Take 500 mg by mouth daily., Disp: , Rfl:    Zinc 50 MG TABS, Take by mouth., Disp: , Rfl:    Zinc Sulfate (ZINC 15 PO), Take 15 mg by mouth daily., Disp: , Rfl:    HYDROcodone-acetaminophen (NORCO/VICODIN) 5-325 MG tablet, Take 1 tablet by mouth every 4 (four) hours as needed., Disp: 10 tablet, Rfl: 0  Current Facility-Administered Medications:    0.9 %  sodium chloride infusion, 500 mL, Intravenous, Once, Armbruster, Carlota Raspberry, MD  Laboratory examination:   Lab Results  Component Value Date   NA 142 05/01/2009   K 4.0 05/01/2009   CO2 26 05/01/2009   GLUCOSE 95 05/01/2009   BUN 22 05/01/2009   CREATININE 0.83 05/01/2009   CALCIUM 9.0 05/01/2009   GFRNONAA >60 05/01/2009       Latest Ref Rng & Units 05/01/2009   11:45 AM  CMP  Glucose 70 - 99 mg/dL 95   BUN 6 - 23 mg/dL 22   Creatinine 0.4 - 1.5 mg/dL 0.83   Sodium 135 - 145 mEq/L 142   Potassium 3.5 - 5.1 mEq/L 4.0   Chloride 96 - 112 mEq/L 109   CO2 19 - 32 mEq/L 26   Calcium 8.4 - 10.5 mg/dL 9.0       Latest Ref Rng & Units 10/23/2018    2:44 PM 05/01/2009   11:45 AM  CBC  WBC 4.0 - 10.5 K/uL 7.4  8.7   Hemoglobin 13.0 - 17.0 g/dL 14.7  14.7   Hematocrit 39.0 - 52.0 % 45.3  44.5   Platelets 150.0 - 400.0 K/uL 323.0  363     Lipid Panel No results for input(s): "CHOL", "TRIG", "  [place]", "VLDL", "HDL", "CHOLHDL", "LDLDIRECT" in the last 8760 hours.  HEMOGLOBIN A1C No results found for: "HGBA1C",  "MPG" TSH No results for input(s): "TSH" in the last 8760 hours.  External labs:     Radiology:    Cardiac Studies:   ECHO IMPRESSIONS 09/17/2022   1. Left ventricular ejection fraction, by estimation, is 60 to 65%. The left ventricle has normal function. The left ventricle has no regional wall motion abnormalities. There is mild asymmetric left ventricular hypertrophy of the infero-lateral segment. Left ventricular diastolic parameters are consistent with Grade II diastolic dysfunction (pseudonormalization). Elevated left ventricular end-diastolic pressure. The average left ventricular global longitudinal strain is -20.1 %. The global longitudinal strain is normal.   2. Right ventricular systolic function is normal. The right ventricular size is mildly enlarged. There is normal pulmonary artery systolic pressure. The estimated right ventricular systolic pressure is 70.7 mmHg.   3. Left atrial size was mildly dilated.   4. The mitral valve is degenerative. Moderate mitral valve regurgitation. Mild mitral stenosis. The mean mitral valve gradient is 5.0 mmHg. PISA MR EROA radius 1cm, MR ERO 0.38cm2, MR RV 70cc   5. The aortic valve is tricuspid. There is moderate calcification of the aortic valve. Aortic valve regurgitation is mild. Aortic valve sclerosis/calcification is present, without any evidence of aortic stenosis. Aortic regurgitation PHT measures 770 msec.   6. Aortic dilatation noted. There is mild dilatation of the ascending aorta, measuring 39 mm.   7. The inferior vena cava is normal in size with greater than 50% respiratory variability, suggesting right atrial pressure of 3 mmHg.     EKG:   10/02/2022 normal sinus rhythm with left atrial enlargement, no evidence of acute ischemia  Assessment     ICD-10-CM   1. Mitral valve insufficiency, unspecified etiology  I34.0 EKG 12-Lead    2. Essential hypertension  I10     3. Mixed hyperlipidemia  E78.2        Orders Placed  This Encounter  Procedures   EKG 12-Lead    Meds ordered this encounter  Medications   metoprolol succinate (TOPROL XL) 25 MG 24 hr tablet    Sig: Take 1 tablet (25 mg total) by mouth daily.    Dispense:  90 tablet    Refill:  3    There are no discontinued medications.   Recommendations:   MESIAH MANZO is a 76 y.o.  male with new severe MR  Mitral valve insufficiency, unspecified etiology I personally reviewed the echo, mitral regurgitation is severe by all parameters Will schedule for TEE at hospital with me Discussed patient should call immediately and return to the office if experiencing palpitations, heart racing, orthopnea, or edema   Essential hypertension Will add low-dose metoprolol Encourage low-sodium diet, less than 2000 mg daily.   Mixed hyperlipidemia Continue statin   Follow-up in 6 weeks or sooner if needed    Floydene Flock, DO, [facility]  10/02/2022, 2:43 PM Office: [phone] Pager: [phone]

## 2022-11-13 ENCOUNTER — Ambulatory Visit: Payer: Medicare Other | Admitting: Internal Medicine

## 2022-11-18 LAB — ECHO TEE
MV M vel: 4.82 m/s
MV Peak grad: 92.9 mmHg
Radius: 0.5 cm

## 2022-11-27 ENCOUNTER — Ambulatory Visit: Payer: Medicare Other | Admitting: Internal Medicine

## 2022-11-27 ENCOUNTER — Encounter: Payer: Self-pay | Admitting: Internal Medicine

## 2022-11-27 DIAGNOSIS — I340 Nonrheumatic mitral (valve) insufficiency: Secondary | ICD-10-CM

## 2022-11-27 DIAGNOSIS — I10 Essential (primary) hypertension: Secondary | ICD-10-CM

## 2022-11-27 DIAGNOSIS — E782 Mixed hyperlipidemia: Secondary | ICD-10-CM

## 2022-11-30 NOTE — Progress Notes (Signed)
Primary Physician/Referring:  Kathyrn Lass, MD  [name], male    DOB: 1946-08-20, 77 y.o.   MRN: [id number]  Chief Complaint  Patient presents with   Follow-up    6 weeks   Hyperlipidemia   Hypertension   HPI:    Scott Ho  is a 77 y.o. male with past medical history significant for COPD, hyperlipidemia, and newly diagnosed mitral regurgitation.  Patient has been doing well since our last visit. Currently the patient is not having any symptoms other than shortness of breath.  He is not retaining any fluid and he does not have any palpitations or cardiac arrhythmia.  He denies chest pain, palpitations, diaphoresis, syncope, edema, orthopnea, PND, claudication.    Past Medical History:  Diagnosis Date   Allergy    Arthritis    Cancer ([place]) 2007   prostate   Cataract    both eyes,removed   COPD (chronic obstructive pulmonary disease) ([place])    light case use inhaler   Past Surgical History:  Procedure Laterality Date   cataracts Bilateral    COLONOSCOPY     POLYPECTOMY     PROSTATECTOMY     TEE WITHOUT CARDIOVERSION N/A 10/18/2022   Procedure: TRANSESOPHAGEAL ECHOCARDIOGRAM (TEE);  Surgeon: Floydene Flock, DO;  Location: [facility];  Service: Cardiovascular;  Laterality: N/A;   WRIST FRACTURE SURGERY Left    Family History  Problem Relation Age of Onset   Colon cancer Neg Hx    Colon polyps Neg Hx    Esophageal cancer Neg Hx    Stomach cancer Neg Hx    Rectal cancer Neg Hx     Social History   Tobacco Use   Smoking status: Former    Packs/day: 3.00    Years: 50.00    Total pack years: 150.00    Types: Cigarettes    Quit date: 11/2013    Years since quitting: 9.0   Smokeless tobacco: Never  Substance Use Topics   Alcohol use: Yes    Comment: occasinally",once every couple of weeks"   Marital Status: Divorced  ROS  Review of Systems  Respiratory:  Positive for shortness of breath.    Objective  There were no vitals taken for this  visit. There is no height or weight on file to calculate BMI.     10/18/2022    1:20 PM 10/18/2022    1:10 PM 10/18/2022    1:01 PM  Vitals with BMI  Systolic 606 96 301  Diastolic 62 62 56  Pulse 58 61 49    Medications and allergies   Allergies  Allergen Reactions   Bee Venom Anaphylaxis     Medication list after today's encounter   Current Outpatient Medications:    B Complex Vitamins (B COMPLEX 100 PO), Take 1 capsule by mouth daily., Disp: , Rfl:    Cyanocobalamin (B-12 PO), Take 1 capsule by mouth daily., Disp: , Rfl:    LORazepam (ATIVAN) 1 MG tablet, Take 1 mg by mouth every 4 (four) hours as needed for anxiety., Disp: , Rfl:    Menaquinone-7 (VITAMIN K2 PO), Take 1 capsule by mouth every other day., Disp: , Rfl:    metoprolol succinate (TOPROL XL) 25 MG 24 hr tablet, Take 1 tablet (25 mg total) by mouth daily., Disp: 90 tablet, Rfl: 3   Multiple Vitamin (MULTIVITAMIN) tablet, Take 1 tablet by mouth daily., Disp: , Rfl:    Omega-3 Fatty Acids (FISH OIL) 1000 MG CAPS, Take  1,000 mg by mouth daily., Disp: , Rfl:    Pyridoxine HCl (B-6 PO), Take 1 capsule by mouth daily., Disp: , Rfl:    rosuvastatin (CRESTOR) 5 MG tablet, Take 5 mg by mouth daily., Disp: , Rfl:    SYMBICORT 160-4.5 MCG/ACT inhaler, INHALE 2 PUFFS INTO THE LUNGS TWICE DAILY, Disp: 30.6 each, Rfl: 2   tadalafil (CIALIS) 10 MG tablet, Take 10 mg by mouth daily as needed for erectile dysfunction., Disp: , Rfl:    VITAMIN A PO, Take 1 capsule by mouth every other day., Disp: , Rfl:    vitamin C (ASCORBIC ACID) 500 MG tablet, Take 500 mg by mouth daily., Disp: , Rfl:    Zinc 50 MG TABS, Take by mouth., Disp: , Rfl:   Current Facility-Administered Medications:    0.9 %  sodium chloride infusion, 500 mL, Intravenous, Once, Armbruster, Carlota Raspberry, MD  Laboratory examination:   Lab Results  Component Value Date   NA 142 05/01/2009   K 4.0 05/01/2009   CO2 26 05/01/2009   GLUCOSE 95 05/01/2009   BUN 22  05/01/2009   CREATININE 0.83 05/01/2009   CALCIUM 9.0 05/01/2009   GFRNONAA >60 05/01/2009       Latest Ref Rng & Units 05/01/2009   11:45 AM  CMP  Glucose 70 - 99 mg/dL 95   BUN 6 - 23 mg/dL 22   Creatinine 0.4 - 1.5 mg/dL 0.83   Sodium 135 - 145 mEq/L 142   Potassium 3.5 - 5.1 mEq/L 4.0   Chloride 96 - 112 mEq/L 109   CO2 19 - 32 mEq/L 26   Calcium 8.4 - 10.5 mg/dL 9.0       Latest Ref Rng & Units 10/23/2018    2:44 PM 05/01/2009   11:45 AM  CBC  WBC 4.0 - 10.5 K/uL 7.4  8.7   Hemoglobin 13.0 - 17.0 g/dL 14.7  14.7   Hematocrit 39.0 - 52.0 % 45.3  44.5   Platelets 150.0 - 400.0 K/uL 323.0  363     Lipid Panel No results for input(s): "CHOL", "TRIG", "[place]", "VLDL", "HDL", "CHOLHDL", "LDLDIRECT" in the last 8760 hours.  HEMOGLOBIN A1C No results found for: "HGBA1C", "MPG" TSH No results for input(s): "TSH" in the last 8760 hours.  External labs:     Radiology:    Cardiac Studies:   ECHO IMPRESSIONS 09/17/2022   1. Left ventricular ejection fraction, by estimation, is 60 to 65%. The left ventricle has normal function. The left ventricle has no regional wall motion abnormalities. There is mild asymmetric left ventricular hypertrophy of the infero-lateral segment. Left ventricular diastolic parameters are consistent with Grade II diastolic dysfunction (pseudonormalization). Elevated left ventricular end-diastolic pressure. The average left ventricular global longitudinal strain is -20.1 %. The global longitudinal strain is normal.   2. Right ventricular systolic function is normal. The right ventricular size is mildly enlarged. There is normal pulmonary artery systolic pressure. The estimated right ventricular systolic pressure is 67.3 mmHg.   3. Left atrial size was mildly dilated.   4. The mitral valve is degenerative. Moderate mitral valve regurgitation. Mild mitral stenosis. The mean mitral valve gradient is 5.0 mmHg. PISA MR EROA radius 1cm, MR ERO 0.38cm2, MR  RV 70cc   5. The aortic valve is tricuspid. There is moderate calcification of the aortic valve. Aortic valve regurgitation is mild. Aortic valve sclerosis/calcification is present, without any evidence of aortic stenosis. Aortic regurgitation PHT measures 770 msec.   6. Aortic dilatation  noted. There is mild dilatation of the ascending aorta, measuring 39 mm.   7. The inferior vena cava is normal in size with greater than 50% respiratory variability, suggesting right atrial pressure of 3 mmHg.     EKG:   10/02/2022 normal sinus rhythm with left atrial enlargement, no evidence of acute ischemia  Assessment     ICD-10-CM   1. Mitral valve insufficiency, unspecified etiology  I34.0     2. Essential hypertension  I10     3. Mixed hyperlipidemia  E78.2        No orders of the defined types were placed in this encounter.   No orders of the defined types were placed in this encounter.   Medications Discontinued During This Encounter  Medication Reason   loratadine (CLARITIN) 10 MG tablet    Multiple Vitamins-Minerals (ICAPS) CAPS Duplicate     Recommendations:   Scott Ho is a 77 y.o.  male with new severe MR  Mitral valve insufficiency, unspecified etiology MR is more moderate-severe after completing TEE Patient feeling well  Essential hypertension Continue current cardiac medications Encourage low-sodium diet, less than 2000 mg daily.   Mixed hyperlipidemia Continue statin   Follow-up in 6 months or sooner if needed    Floydene Flock, DO, [facility]  11/30/2022, 3:58 PM Office: [phone] Pager: [phone]

## 2022-12-25 ENCOUNTER — Other Ambulatory Visit: Payer: Self-pay | Admitting: Internal Medicine

## 2023-03-10 ENCOUNTER — Other Ambulatory Visit: Payer: Self-pay | Admitting: Acute Care

## 2023-03-10 DIAGNOSIS — Z87891 Personal history of nicotine dependence: Secondary | ICD-10-CM

## 2023-03-10 DIAGNOSIS — Z122 Encounter for screening for malignant neoplasm of respiratory organs: Secondary | ICD-10-CM

## 2023-04-03 ENCOUNTER — Other Ambulatory Visit: Payer: Medicare Other

## 2023-06-10 ENCOUNTER — Ambulatory Visit
Admission: RE | Admit: 2023-06-10 | Discharge: 2023-06-10 | Disposition: A | Discharge status: Home / Self Care | Payer: Medicare Other | Source: Ambulatory Visit | Attending: Acute Care | Admitting: Acute Care

## 2023-06-10 DIAGNOSIS — Z87891 Personal history of nicotine dependence: Secondary | ICD-10-CM

## 2023-06-10 DIAGNOSIS — Z122 Encounter for screening for malignant neoplasm of respiratory organs: Secondary | ICD-10-CM

## 2023-06-18 ENCOUNTER — Other Ambulatory Visit: Payer: Self-pay

## 2023-06-18 DIAGNOSIS — Z122 Encounter for screening for malignant neoplasm of respiratory organs: Secondary | ICD-10-CM

## 2023-06-18 DIAGNOSIS — Z87891 Personal history of nicotine dependence: Secondary | ICD-10-CM

## 2023-07-15 ENCOUNTER — Other Ambulatory Visit: Payer: Self-pay

## 2023-07-15 ENCOUNTER — Encounter (HOSPITAL_BASED_OUTPATIENT_CLINIC_OR_DEPARTMENT_OTHER): Payer: Self-pay

## 2023-07-15 ENCOUNTER — Emergency Department (HOSPITAL_BASED_OUTPATIENT_CLINIC_OR_DEPARTMENT_OTHER): Payer: Medicare Other | Admitting: Radiology

## 2023-07-15 DIAGNOSIS — Z79899 Other long term (current) drug therapy: Secondary | ICD-10-CM | POA: Insufficient documentation

## 2023-07-15 DIAGNOSIS — W2203XA Walked into furniture, initial encounter: Secondary | ICD-10-CM | POA: Insufficient documentation

## 2023-07-15 DIAGNOSIS — S81812A Laceration without foreign body, left lower leg, initial encounter: Secondary | ICD-10-CM | POA: Diagnosis not present

## 2023-07-15 DIAGNOSIS — I10 Essential (primary) hypertension: Secondary | ICD-10-CM | POA: Diagnosis not present

## 2023-07-15 DIAGNOSIS — S8992XA Unspecified injury of left lower leg, initial encounter: Secondary | ICD-10-CM | POA: Diagnosis present

## 2023-07-15 NOTE — ED Triage Notes (Addendum)
Pt to ED c/o left lower leg laceration, pt tripped on a wooden bed frame and cut leg. Large laceration noted, bleeding controlled. Swelling noted to left lower leg, Reports tetanus shot 3-5 years ago.

## 2023-07-16 ENCOUNTER — Emergency Department (HOSPITAL_BASED_OUTPATIENT_CLINIC_OR_DEPARTMENT_OTHER)
Admission: EM | Admit: 2023-07-16 | Discharge: 2023-07-16 | Disposition: A | Discharge status: Home / Self Care | Payer: Medicare Other | Attending: Emergency Medicine | Admitting: Emergency Medicine

## 2023-07-16 DIAGNOSIS — S81812A Laceration without foreign body, left lower leg, initial encounter: Secondary | ICD-10-CM

## 2023-07-16 MED ORDER — LIDOCAINE HCL (PF) 1 % IJ SOLN
10.0000 mL | Freq: Once | INTRAMUSCULAR | Status: AC
  Administered 2023-07-16: 10 mL via INTRADERMAL
  Filled 2023-07-16: qty 10

## 2023-07-16 MED ORDER — CEPHALEXIN 500 MG PO CAPS: 500.0000 mg | ORAL_CAPSULE | Freq: Four times a day (QID) | ORAL | 0 refills | Status: AC

## 2023-07-16 MED ORDER — CEPHALEXIN 250 MG PO CAPS
500.0000 mg | ORAL_CAPSULE | Freq: Once | ORAL | Status: AC
  Administered 2023-07-16: 500 mg via ORAL
  Filled 2023-07-16: qty 2

## 2023-07-16 NOTE — ED Notes (Signed)
Wound dressed per MD instructions

## 2023-07-16 NOTE — ED Provider Notes (Signed)
[place] EMERGENCY DEPARTMENT AT [facility] Provider Note   CSN: [id number] Arrival date & time: 07/15/23  2056     History  Chief Complaint  Patient presents with   Extremity Laceration    Left leg    Scott Ho is a 77 y.o. male.  Patient is a 76 year old male with history of hypertension, hyperlipidemia.  Patient presenting today for evaluation of a leg laceration.  He was attempting to step over a bed frame when he slipped and struck his shin.  He has a large laceration to the left lower leg.  Bleeding was controlled with direct pressure.  Last tetanus was 3 years ago.  The history is provided by the patient.       Home Medications Prior to Admission medications   Medication Sig Start Date End Date Taking? Authorizing Provider  B Complex Vitamins (B COMPLEX 100 PO) Take 1 capsule by mouth daily.    [provider]  Cyanocobalamin (B-12 PO) Take 1 capsule by mouth daily.    [provider]  LORazepam (ATIVAN) 1 MG tablet Take 1 mg by mouth every 4 (four) hours as needed for anxiety.    [provider]  Menaquinone-7 (VITAMIN K2 PO) Take 1 capsule by mouth every other day.    [provider]  metoprolol succinate (TOPROL XL) 25 MG 24 hr tablet Take 1 tablet (25 mg total) by mouth daily. 10/02/22   Custovic, Rozell Searing, DO  Multiple Vitamin (MULTIVITAMIN) tablet Take 1 tablet by mouth daily.    [provider]  Omega-3 Fatty Acids (FISH OIL) 1000 MG CAPS Take 1,000 mg by mouth daily.    [provider]  Pyridoxine HCl (B-6 PO) Take 1 capsule by mouth daily.    [provider]  rosuvastatin (CRESTOR) 5 MG tablet Take 5 mg by mouth daily. 06/11/22   [provider]  SYMBICORT 160-4.5 MCG/ACT inhaler INHALE 2 PUFFS BY MOUTH TWICE A DAY 12/27/22   Nyoka Cowden, MD  tadalafil (CIALIS) 10 MG tablet Take 10 mg by mouth daily as needed for erectile dysfunction.    [provider]  VITAMIN A PO  Take 1 capsule by mouth every other day.    [provider]  vitamin C (ASCORBIC ACID) 500 MG tablet Take 500 mg by mouth daily.    [provider]  Zinc 50 MG TABS Take by mouth.    [provider]      Allergies    Bee venom    Review of Systems   Review of Systems  All other systems reviewed and are negative.   Physical Exam Updated Vital Signs BP (!) 148/78   Pulse 74   Temp 97.9 F (36.6 C) (Oral)   Resp 18   Ht 5\' 9"  (1.753 m)   Wt 79.4 kg   SpO2 97%   BMI 25.84 kg/m  Physical Exam Vitals and nursing note reviewed.  Constitutional:      Appearance: Normal appearance.  Pulmonary:     Effort: Pulmonary effort is normal.  Musculoskeletal:     Comments: There is a large, J-shaped laceration noted to the left lower leg just proximal to the ankle joint.  No tendons are involved.  Skin:    General: Skin is warm and dry.  Neurological:     Mental Status: He is alert and oriented to person, place, and time.     ED Results / Procedures / Treatments   Labs (all labs  ordered are listed, but only abnormal results are displayed) Labs Reviewed - No data to display  EKG None  Radiology DG Tibia/Fibula Left  Result Date: 07/15/2023 CLINICAL DATA:  Injury, left lower leg laceration. EXAM: LEFT TIBIA AND FIBULA - 2 VIEW COMPARISON:  None Available. FINDINGS: No acute fracture or dislocation is seen. There is bony deformity of the proximal fibula, which may be related to old trauma degenerative changes are present at the knee. Skin defect is noted to the anterior aspect of the distal tibia. No radiopaque foreign body is seen. IMPRESSION: No acute osseous abnormality or radiopaque foreign body. Electronically Signed   By: Thornell Sartorius M.D.   On: 07/15/2023 21:46    Procedures Procedures   LACERATION REPAIR Performed by: Geoffery Lyons Authorized by: Geoffery Lyons Consent: Verbal consent obtained. Risks and benefits: risks, benefits and  alternatives were discussed Consent given by: patient Patient identity confirmed: provided demographic data Prepped and Draped in normal sterile fashion Wound explored  Laceration Location: Left lower leg  Laceration Length: 15 cm  No Foreign Bodies seen or palpated  Anesthesia: local infiltration  Local anesthetic: lidocaine 1% without epinephrine  Anesthetic total: 10 ml  Irrigation method: syringe Amount of cleaning: standard  Skin closure: 3-0 Ethilon  Number of sutures: 16  Technique: Simple interrupted.  Steri-Strips were also applied between the sutures for added strength and support.  Patient tolerance: Patient tolerated the procedure well with no immediate complications.   Medications Ordered in ED Medications  lidocaine (PF) (XYLOCAINE) 1 % injection 10 mL (has no administration in time range)    ED Course/ Medical Decision Making/ A&P  Patient presenting with a left leg laceration as described in the HPI.  The wound was repaired as above.  Patient to be discharged with Keflex, local wound care, and sutures to be removed in 2 weeks.  Tetanus up-to-date.  I did apply Steri-Strips between the sutures for added strength due to the gaping nature of the wound.  Final Clinical Impression(s) / ED Diagnoses Final diagnoses:  None    Rx / DC Orders ED Discharge Orders     None         Geoffery Lyons, MD 07/16/23 [phone]

## 2023-07-16 NOTE — Discharge Instructions (Addendum)
Local wound care with bacitracin and dressing changes twice daily.  Keep wound clean and dry.  Begin taking Keflex as prescribed.  Sutures are to be removed in 2 weeks.  Please follow-up with your primary doctor for this.  Return to the emergency department if you develop redness surrounding the wound, pus draining from the wound, red streaks up or down your leg, or for other new and concerning symptoms.

## 2024-01-29 ENCOUNTER — Other Ambulatory Visit: Payer: Self-pay | Admitting: Internal Medicine

## 2024-02-02 ENCOUNTER — Other Ambulatory Visit: Payer: Self-pay | Admitting: Internal Medicine

## 2024-02-06 ENCOUNTER — Telehealth: Payer: Self-pay | Admitting: Internal Medicine

## 2024-02-06 MED ORDER — BUDESONIDE-FORMOTEROL FUMARATE 160-4.5 MCG/ACT IN AERO: 2.0000 | INHALATION_SPRAY | Freq: Two times a day (BID) | RESPIRATORY_TRACT | 1 refills | Status: DC

## 2024-02-06 NOTE — Telephone Encounter (Signed)
 Patient needs a refill for prescription Symbicort. Will make appointment   Pharmacy: [facility] [employer] [facility] [address]

## 2024-02-06 NOTE — Telephone Encounter (Signed)
 I called and spoke with the pt  He is needing refill on symbicort  He has scheduled appt with Dr. Sherene Sires for 04/01/24  I sent enough to get him through to appt and advised be sure and keep the appt for future rx  He verbalized understanding  Nothing further needed

## 2024-04-01 ENCOUNTER — Encounter: Payer: Self-pay | Admitting: Internal Medicine

## 2024-04-01 ENCOUNTER — Ambulatory Visit: Admitting: Internal Medicine

## 2024-04-01 VITALS — BP 124/70 | HR 92 | Ht 70.0 in | Wt 183.0 lb

## 2024-04-01 DIAGNOSIS — J449 Chronic obstructive pulmonary disease, unspecified: Secondary | ICD-10-CM | POA: Diagnosis not present

## 2024-04-01 DIAGNOSIS — Z87891 Personal history of nicotine dependence: Secondary | ICD-10-CM | POA: Diagnosis not present

## 2024-04-01 DIAGNOSIS — R011 Cardiac murmur, unspecified: Secondary | ICD-10-CM

## 2024-04-01 MED ORDER — BUDESONIDE-FORMOTEROL FUMARATE 160-4.5 MCG/ACT IN AERO: 2.0000 | INHALATION_SPRAY | Freq: Two times a day (BID) | RESPIRATORY_TRACT | 11 refills | Status: AC

## 2024-04-01 NOTE — Patient Instructions (Signed)
 No change in medications   Let me know if you want to see a throat doctor (ENT)   Work on inhaler technique:  relax and gently blow all the way out then take a nice smooth full deep breath back in, triggering the inhaler at same time you start breathing in.  Hold breath in for at least  5 seconds if you can. Blow out symbicort  160  thru nose. Rinse and gargle with water when done.  If mouth or throat bother you at all,  try brushing teeth/gums/tongue with arm and hammer toothpaste/ make a slurry and gargle and spit out.   Please schedule a follow up visit in 12 months but call sooner if needed

## 2024-04-01 NOTE — Progress Notes (Unsigned)
 Rico Charters, male    DOB: Apr 07, 1946     MRN: [id number]    Brief patient profile:  21    yowm MM/GOLD III copd  with h/o RA since his 50's controls with prn prednisone   quit smoking 2014 with  some cough that resolved and bothered more by nasal congestion since around 2017 which is  no better with otc's or nasal but never seen by ent / allergy and underwent  LDSCT 08/19/18 which suggested bronchiolitis/ copd changes so referred to pulmonary clinic 09/08/2018 by Dr   Langston Pippins and proved to have GOLD III criteria 10/23/2018    History of Present Illness  09/08/2018 Pulmonary/ 1st office eval/ Adrain Nesbit  Chief Complaint  Patient presents with   Consult    Former smoker, abnormal CT, states he has occasional SOB denies chest discomfort or cough. States he just wanted to make sure he does not have cancer.   Dyspnea:  1.4 miles in 28 minutes around neighborhood includes some hills s limiting sob / more limited by arthritis typically than sob Cough: am congestion min white mucus  Sleep: no problem flat/ 2 pillows  Previously needed pred 5 mg "daily" for RA but since around 2014 not needing regularly  rec No change rx    09/02/2022  f/u ov/Kelsen Celona re: GOLD 3 COPD    maint on symbicort  160 2 puff prior to ex   Chief Complaint  Patient presents with   Follow-up    F/u chest CT. COPD  Dyspnea:  able to do yardwork, walking up to an hour some hills  Cough: assoc with nasal drainage only  Sleeping: flat bed/ 2 pillows  SABA use: none  02: none   Rec No change in medications recommendation Please schedule a follow up visit in 12  months but call sooner if needed    Echo  10/18/22 TEE :    Mod/ severe MR/ Mild AR with Mild LAE   04/01/2024  f/u ov/Liel Rudden re: GOLD 3 copd   maint on Symbiocrt  160 x 2  each am  Chief Complaint  Patient presents with   Follow-up    COPD f/u.   Dyspnea:  improved overall  Cough: hoarseness / raspy voice, no excess or purulent mucus  Sleeping: flat bed  2 pillows   resp cc  SABA use: none  02: none   Lung cancer screening :  in program for q  July     No obvious day to day or daytime variability or assoc excess/ purulent sputum or mucus plugs or hemoptysis or cp or chest tightness, subjective wheeze or overt sinus or hb symptoms.    Also denies any obvious fluctuation of symptoms with weather or environmental changes or other aggravating or alleviating factors except as outlined above   No unusual exposure hx or h/o childhood pna/ asthma or knowledge of premature birth.  Current Allergies, Complete Past Medical History, Past Surgical History, Family History, and Social History were reviewed in [employer] record.  ROS  The following are not active complaints unless bolded Hoarseness, sore throat, dysphagia, dental problems, itching, sneezing,  nasal congestion or discharge of excess mucus or purulent secretions, ear ache,   fever, chills, sweats, unintended wt loss or wt gain, classically pleuritic or exertional cp,  orthopnea pnd or arm/hand swelling  or leg swelling, presyncope, palpitations, abdominal pain, anorexia, nausea, vomiting, diarrhea  or change in bowel habits or change in bladder habits, change in stools  or change in urine, dysuria, hematuria,  rash, arthralgias, visual complaints, headache, numbness, weakness or ataxia or problems with walking or coordination,  change in mood or  memory.        Current Meds  Medication Sig   B Complex Vitamins (B COMPLEX 100 PO) Take 1 capsule by mouth daily.   budesonide -formoterol  (SYMBICORT ) 160-4.5 MCG/ACT inhaler Inhale 2 puffs into the lungs 2 (two) times daily.   cephALEXin  (KEFLEX ) 500 MG capsule Take 1 capsule (500 mg total) by mouth 4 (four) times daily.   Cyanocobalamin (B-12 PO) Take 1 capsule by mouth daily.   LORazepam (ATIVAN) 1 MG tablet Take 1 mg by mouth every 4 (four) hours as needed for anxiety.   Menaquinone-7 (VITAMIN K2 PO) Take 1 capsule by mouth every  other day.   metoprolol  succinate (TOPROL  XL) 25 MG 24 hr tablet Take 1 tablet (25 mg total) by mouth daily.   Multiple Vitamin (MULTIVITAMIN) tablet Take 1 tablet by mouth daily.   Omega-3 Fatty Acids (FISH OIL) 1000 MG CAPS Take 1,000 mg by mouth daily.   Pyridoxine HCl (B-6 PO) Take 1 capsule by mouth daily.   rosuvastatin (CRESTOR) 5 MG tablet Take 5 mg by mouth daily.   tadalafil (CIALIS) 10 MG tablet Take 10 mg by mouth daily as needed for erectile dysfunction.   VITAMIN A PO Take 1 capsule by mouth every other day.   vitamin C (ASCORBIC ACID) 500 MG tablet Take 500 mg by mouth daily.   Zinc 50 MG TABS Take by mouth.   Current Facility-Administered Medications for the 04/01/24 encounter (Office Visit) with Diamond Formica, MD  Medication   0.9 %  sodium chloride  infusion                        Objective:    Wts  04/01/2024        183  09/02/2022      180  07/09/2021        180  08/28/2020      178  12/24/2019          220  01/26/2019        213    09/08/18 200 lb (90.7 kg)  07/15/18 199 lb (90.3 kg)  07/01/18 199 lb 6.4 oz (90.4 kg)    Vital signs reviewed  04/01/2024  - Note at rest 02 sats  95% on RA   General appearance:    amb mildly hoarse wm nad    HEENT : Oropharynx  clear   Nasal turbinates nl    NECK :  without  apparent JVD/ palpable Nodes/TM    LUNGS: no acc muscle use,  Min barrel  contour chest wall with bilateral  slightly decreased bs s audible wheeze and  without cough on insp or exp maneuvers and min  Hyperresonant  to  percussion bilaterally    CV:  RRR  no s3  with 2-3/6 HSM s  increase in P2, and no edema   ABD:  soft and nontender with pos end  insp Hoover's  in the supine position.  No bruits or organomegaly appreciated   MS:  Nl gait/ ext warm without deformities Or obvious joint restrictions  calf tenderness, cyanosis or clubbing     SKIN: warm and dry without lesions    NEURO:  alert, approp, nl sensorium with  no motor or cerebellar  deficits apparent.  Assessment

## 2024-04-02 NOTE — Assessment & Plan Note (Signed)
 Echo  09/17/22   Mild MS/ Mild AR with Mild LAE - TEE 10/18/22  Moderate to severe mitral valve regurgitation.   Aortic valve regurgitation is mild. Mild LAE   Reviewed valvular ht dz and how it can overlap in terms of symptoms with copd/ emphasized regular f/u with cards         Each maintenance medication was reviewed in detail including emphasizing most importantly the difference between maintenance and prns and under what circumstances the prns are to be triggered using an action plan format where appropriate.  Total time for H and P, chart review, counseling, reviewing hfa device(s) and generating customized AVS unique to this office visit / same day charting = 22 min

## 2024-04-02 NOTE — Assessment & Plan Note (Signed)
 Quit smoking 2014 but also has RA Spirometry 09/08/2018  FEV1 1.0 (30%)  Ratio 56 with classic curvature   On no rx    - PFT's  10/23/2018  FEV1 1.25  (40 % ) ratio 49  p 14 % improvement from saba p nothing prior to study with DLCO  81 % corrects to 124 % for alv volume   - Alpha One AT screening 10/23/2018   MM level 139  - 08/28/2020  After extensive coaching inhaler device,  effectiveness =    75%   Doing fine on just symbiocrt 160 x 2 each am x for mild hoarseness  Rec: Arm and hammer toothpaste Consider spacer Ent prn

## 2024-06-10 ENCOUNTER — Encounter: Payer: Self-pay | Admitting: Acute Care

## 2024-07-13 ENCOUNTER — Telehealth: Payer: Self-pay

## 2024-07-13 NOTE — Telephone Encounter (Signed)
 Copied from CRM #[id number]. Topic: Clinical - Request for Lab/Test Order >> Jul 12, 2024  1:56 PM Russell PARAS wrote: Reason for CRM:   Pt is contacting clinic to schedule annual LCS. Advised there is no order yet in chart, pt requested if order could be placed and be contacted for scheduling.  CB # [phone]  Please advise. Thank you

## 2024-07-13 NOTE — Telephone Encounter (Signed)
 Called and spoke to pt. He is wanting to have his annual LDCT scheduled. However, per CMS they will only cover a LCS scan up until age 78yo. Patient is currently ineligible for screening. Patient is still interested in nodule surveillance, pt was an LR2 on his 2024 scan. Advised patient to discuss his interest in still having an annual scan with his PCP and if she finds it clinically indicated to order a CT chest wo for nodule follow up. Pt verbalized understanding and denied any further questions or concerns at this time. Will send to PCP as FYI.

## 2024-08-04 DIAGNOSIS — Z08 Encounter for follow-up examination after completed treatment for malignant neoplasm: Secondary | ICD-10-CM | POA: Diagnosis not present

## 2024-08-04 DIAGNOSIS — Z0000 Encounter for general adult medical examination without abnormal findings: Secondary | ICD-10-CM | POA: Diagnosis not present

## 2024-08-04 DIAGNOSIS — Z23 Encounter for immunization: Secondary | ICD-10-CM | POA: Diagnosis not present

## 2024-08-04 DIAGNOSIS — E7800 Pure hypercholesterolemia, unspecified: Secondary | ICD-10-CM | POA: Diagnosis not present

## 2024-09-04 DIAGNOSIS — N390 Urinary tract infection, site not specified: Secondary | ICD-10-CM | POA: Diagnosis not present
